# Patient Record
Sex: Male | Born: 1960 | Race: Black or African American | Hispanic: No | Marital: Single | Smoking: Never smoker
Health system: Southern US, Community
[De-identification: ages and names within clinical notes are randomized; demographics above are authoritative.]

## PROBLEM LIST (undated history)

## (undated) DIAGNOSIS — K219 Gastro-esophageal reflux disease without esophagitis: Secondary | ICD-10-CM

## (undated) DIAGNOSIS — I10 Essential (primary) hypertension: Secondary | ICD-10-CM

## (undated) DIAGNOSIS — N529 Male erectile dysfunction, unspecified: Secondary | ICD-10-CM

## (undated) DIAGNOSIS — Z8619 Personal history of other infectious and parasitic diseases: Secondary | ICD-10-CM

## (undated) HISTORY — DX: Male erectile dysfunction, unspecified: N52.9

## (undated) HISTORY — DX: Personal history of other infectious and parasitic diseases: Z86.19

## (undated) HISTORY — PX: BACK SURGERY: SHX140

## (undated) HISTORY — PX: SHOULDER SURGERY: SHX246

## (undated) HISTORY — DX: Gastro-esophageal reflux disease without esophagitis: K21.9

---

## 1995-12-15 HISTORY — PX: INGUINAL HERNIA REPAIR: SUR1180

## 2005-02-11 DIAGNOSIS — Z8619 Personal history of other infectious and parasitic diseases: Secondary | ICD-10-CM

## 2005-02-11 HISTORY — DX: Personal history of other infectious and parasitic diseases: Z86.19

## 2007-12-12 ENCOUNTER — Encounter: Admission: RE | Admit: 2007-12-12 | Discharge: 2007-12-12 | Payer: Self-pay | Admitting: Interventional Cardiology

## 2007-12-14 ENCOUNTER — Ambulatory Visit (HOSPITAL_COMMUNITY): Admission: RE | Admit: 2007-12-14 | Discharge: 2007-12-14 | Payer: Self-pay | Admitting: Interventional Cardiology

## 2008-07-12 ENCOUNTER — Encounter: Admission: RE | Admit: 2008-07-12 | Discharge: 2008-07-12 | Payer: Self-pay | Admitting: Family Medicine

## 2011-01-05 ENCOUNTER — Encounter: Payer: Self-pay | Admitting: Family Medicine

## 2011-04-28 NOTE — Cardiovascular Report (Signed)
Andrew Blevins, Andrew Blevins                ACCOUNT NO.:  [id number]   MEDICAL RECORD NO.:  [id number]          PATIENT TYPE:  OIB   LOCATION:  2852                         FACILITY:  [facility]   PHYSICIAN:  Lyn Records, M.D.   DATE OF BIRTH:  1961/05/19   DATE OF PROCEDURE:  DATE OF DISCHARGE:                            CARDIAC CATHETERIZATION   INDICATIONS:  The patient has had atypical chest pain and two abnormal  stress tests.  The first test was done in November by Dr. Donette Larry.  It  was abnormal because of exercise-induced ventricular ectopy.  A  Cardiolite 2-day myocardial perfusion study done in December  demonstrated what appeared to be anteroapical ischemia and possibly  diaphragm attenuation with inferior wall decreased uptake.  After  speaking with Mr. Buchberger we decided to proceed with coronary angiography  to document anatomy given the patient's family history of coronary  artery disease (brother with bypass surgery at age 52) and  hyperlipidemia.   PROCEDURE PERFORMED:  1. Left heart cath.  2. Selective coronary angiography.  3. Left ventriculography.  4. Angio-Seal arteriotomy closure.   DESCRIPTION:  After informed consent a 6-French sheath was placed in the  right femoral artery using modified Seldinger technique.  A 6-French A2  multipurpose catheter was used for hemodynamic recordings, left  ventriculography by hand injection, and selective left and right  coronary angiography.  The patient tolerated the procedure without  complications.   RESULTS:  1. Hemodynamic data:      a.     Aortic pressure 105/5 mm mercury.      b.     Aortic pressure 104/64 mmHg.  2. Left ventriculography:  The left ventricle is normal in size and      demonstrates normal contractility.  The ejection fraction is 50%.  3. Coronary angiography.      a.     Left main coronary:  Normal and widely patent.      b.     Left anterior descending coronary:  The left anterior       descending coronary  artery is widely patent.  It gives origin to       two large diagonal branches.  The LAD wraps around the left       ventricular apex.  No significant obstructive lesions are noted.       The LAD and branches are entirely smooth.      c.     Circumflex artery:  The circumflex coronary artery is a       large vessel that gives origin to four obtuse marginal branches.       The first two branches are small.  The third and fourth branches       are large.  The circumflex system is large and widely patent.      d.     Right coronary:  The right coronary artery is the dominant       vessel.  It gives origin to the posterior descending and two small       left ventricular branches, evident tortuosity.  There  is no       abnormality in the right coronary.   CONCLUSION:  1. Normal coronary arteries.  2. Low normal LV function with EF of 50%.   PLAN:  No further cardiac evaluation.  Consider GI sources of pain.  Exercise-induced ventricular ectopy of uncertain significance.  Low  normal LV function needs to be watched over time.  The patient's blood  pressure needs to be managed aggressively to prevent left ventricular  dysfunction.      Lyn Records, M.D.  Electronically Signed     HWS/MEDQ  D:  12/14/2007  T:  12/15/2007  Job:  [id number]   cc:   Georgann Housekeeper, MD

## 2012-01-02 ENCOUNTER — Emergency Department (INDEPENDENT_AMBULATORY_CARE_PROVIDER_SITE_OTHER): Payer: 59

## 2012-01-02 ENCOUNTER — Other Ambulatory Visit: Payer: Self-pay

## 2012-01-02 ENCOUNTER — Encounter (HOSPITAL_BASED_OUTPATIENT_CLINIC_OR_DEPARTMENT_OTHER): Payer: Self-pay | Admitting: *Deleted

## 2012-01-02 ENCOUNTER — Emergency Department (HOSPITAL_BASED_OUTPATIENT_CLINIC_OR_DEPARTMENT_OTHER)
Admission: EM | Admit: 2012-01-02 | Discharge: 2012-01-02 | Disposition: A | Discharge status: Home / Self Care | Payer: 59 | Attending: Emergency Medicine | Admitting: Emergency Medicine

## 2012-01-02 DIAGNOSIS — R079 Chest pain, unspecified: Secondary | ICD-10-CM

## 2012-01-02 DIAGNOSIS — R071 Chest pain on breathing: Secondary | ICD-10-CM | POA: Insufficient documentation

## 2012-01-02 DIAGNOSIS — R0789 Other chest pain: Secondary | ICD-10-CM

## 2012-01-02 DIAGNOSIS — I10 Essential (primary) hypertension: Secondary | ICD-10-CM | POA: Insufficient documentation

## 2012-01-02 HISTORY — DX: Essential (primary) hypertension: I10

## 2012-01-02 NOTE — ED Notes (Signed)
Pt reports chest pain started on Thursday [facility] [employer] [facility]- states pain increased with breathing- reports slight SOB with movement

## 2012-01-02 NOTE — ED Provider Notes (Signed)
History    Scribed for Hurman Horn, MD, the patient was seen in room MH08/MH08. This chart was scribed by Katha Cabal.   CSN: [id number]  Arrival date & time 01/02/12  1255   First MD Initiated Contact with Patient 01/02/12 1509      Chief Complaint  Patient presents with  . Chest Pain    (Consider location/radiation/quality/duration/timing/severity/associated sxs/prior treatment) HPI  Andrew Blevins is a 51 y.o. male who presents to the Emergency Department complaining of intermittent chest pain that began several days ago.  Symptoms are associated with postitional torso discomfort.  Symptoms are not associated with exertion, fever, weakness, cough, syncope or shortness of breath.  Patient was working out with his son a couple days ago and has experienced intermittent chest pain since onset..  Patient just started working out again.  Pain in keft lateral chest wall that increased certain position changes.  Patient states that he feels like he strained a muscle.  Nonradiating localized tenderness constant for days without treatment PTA.       Past Medical History  Diagnosis Date  . Hypertension     Past Surgical History  Procedure Date  . Hernia repair   . Shoulder surgery     No family history on file.  History  Substance Use Topics  . Smoking status: Never Smoker   . Smokeless tobacco: Never Used  . Alcohol Use: Yes     occasional      Review of Systems  Constitutional:       10 Systems reviewed and are negative for acute change except as noted in the HPI.  HENT: Negative for congestion.   Eyes: Negative for discharge and redness.  Cardiovascular: Positive for chest pain.  Musculoskeletal: Negative for back pain.  Skin: Negative for rash.  Psychiatric/Behavioral:       No behavior change.    Allergies  Review of patient's allergies indicates no known allergies.  Home Medications   Current Outpatient Rx  Name Route Sig Dispense Refill  .  OLMESARTAN MEDOXOMIL 20 MG PO TABS Oral Take 20 mg by mouth daily.    Marland Kitchen RABEPRAZOLE SODIUM 20 MG PO TBEC Oral Take 20 mg by mouth daily.      BP 110/65  Pulse 87  Temp(Src) 98.9 F (37.2 C) (Oral)  Resp 20  SpO2 96%  Physical Exam  Nursing note and vitals reviewed. Constitutional:       Awake, alert, nontoxic appearance.  HENT:  Head: Atraumatic.  Eyes: EOM are normal. Right eye exhibits no discharge. Left eye exhibits no discharge.  Neck: Neck supple.  Cardiovascular: Normal rate, regular rhythm and normal heart sounds.   No murmur heard. Pulmonary/Chest: Effort normal. He exhibits tenderness.       Reproducible Left upper, left lateral chest wall tenderness  Abdominal: Soft. There is no tenderness. There is no rebound.  Musculoskeletal: He exhibits no tenderness.       Baseline ROM, no obvious new focal weakness.  Lymphadenopathy:    He has no cervical adenopathy.  Neurological:       Mental status and motor strength appears baseline for patient and situation.  Skin: No rash noted.  Psychiatric: He has a normal mood and affect.    ED Course  Procedures (including critical care time) ECG: Normal sinus rhythm, ventricular rate 74, normal axis, normal intervals, no acute ischemic changes noted, no comparison available impression normal ECG  DIAGNOSTIC STUDIES: Oxygen Saturation is 96% on room air, normal  by my interpretation.     COORDINATION OF CARE: 3:14 PM  Physical exam complete.   Will order EKG and CXR.  4:25 PM  Chest xray negative.   4:27 PM  Discussed radiological findings with patient.  Plan to discharge patient home.  Patient agrees with plan.       LABS / RADIOLOGY:   Labs Reviewed - No data to display Dg Chest 2 View  01/02/2012  *RADIOLOGY REPORT*  Clinical Data: Chest pain  CHEST - 2 VIEW  Comparison:  None.  Findings:  The heart size and mediastinal contours are within normal limits.  Both lungs are clear.  The visualized skeletal structures are  unremarkable.  IMPRESSION: No active cardiopulmonary disease.  Original Report Authenticated By: Harrel Lemon, M.D.     1. Chest wall pain       MDM  I doubt any other [facility] precluding discharge at this time including, but not necessarily limited to the following:ACS.   I personally performed the services described in this documentation, which was scribed in my presence. The recorded information has been reviewed and considered.  Hurman Horn, MD 01/03/12 1146

## 2012-01-13 ENCOUNTER — Emergency Department (HOSPITAL_BASED_OUTPATIENT_CLINIC_OR_DEPARTMENT_OTHER)
Admission: EM | Admit: 2012-01-13 | Discharge: 2012-01-13 | Disposition: A | Discharge status: Home / Self Care | Payer: 59 | Attending: Emergency Medicine | Admitting: Emergency Medicine

## 2012-01-13 ENCOUNTER — Encounter (HOSPITAL_BASED_OUTPATIENT_CLINIC_OR_DEPARTMENT_OTHER): Payer: Self-pay | Admitting: *Deleted

## 2012-01-13 DIAGNOSIS — B9789 Other viral agents as the cause of diseases classified elsewhere: Secondary | ICD-10-CM

## 2012-01-13 DIAGNOSIS — R059 Cough, unspecified: Secondary | ICD-10-CM | POA: Insufficient documentation

## 2012-01-13 DIAGNOSIS — R05 Cough: Secondary | ICD-10-CM | POA: Insufficient documentation

## 2012-01-13 DIAGNOSIS — I10 Essential (primary) hypertension: Secondary | ICD-10-CM | POA: Insufficient documentation

## 2012-01-13 DIAGNOSIS — IMO0001 Reserved for inherently not codable concepts without codable children: Secondary | ICD-10-CM | POA: Insufficient documentation

## 2012-01-13 MED ORDER — KETOROLAC TROMETHAMINE 60 MG/2ML IM SOLN
60.0000 mg | Freq: Once | INTRAMUSCULAR | Status: AC
  Administered 2012-01-13: 60 mg via INTRAMUSCULAR
  Filled 2012-01-13: qty 2

## 2012-01-13 NOTE — ED Provider Notes (Signed)
History     CSN: [id number]  Arrival date & time 01/13/12  1313   First MD Initiated Contact with Patient 01/13/12 1328      Chief Complaint  Patient presents with  . Fever  . Cough    (Consider location/radiation/quality/duration/timing/severity/associated sxs/prior treatment) Patient is a 51 y.o. male presenting with fever and cough. The history is provided by the patient. No language interpreter was used.  Fever Primary symptoms of the febrile illness include fever, cough and myalgias. The current episode started 2 days ago. This is a new problem. The problem has not changed since onset. The fever began 2 days ago. The fever has been unchanged since its onset. The maximum temperature recorded prior to his arrival was 100 to 100.9 F.  The cough began 2 days ago. The cough is productive. The sputum is green.  Cough Associated symptoms include myalgias.  No known exposure to sick contacts.  Past Medical History  Diagnosis Date  . Hypertension     Past Surgical History  Procedure Date  . Hernia repair   . Shoulder surgery     History reviewed. No pertinent family history.  History  Substance Use Topics  . Smoking status: Never Smoker   . Smokeless tobacco: Never Used  . Alcohol Use: Yes     occasional      Review of Systems  Constitutional: Positive for fever.  Respiratory: Positive for cough.   Musculoskeletal: Positive for myalgias.  All other systems reviewed and are negative.    Allergies  Review of patient's allergies indicates no known allergies.  Home Medications   Current Outpatient Rx  Name Route Sig Dispense Refill  . OLMESARTAN MEDOXOMIL 20 MG PO TABS Oral Take 20 mg by mouth daily.    Marland Kitchen RABEPRAZOLE SODIUM 20 MG PO TBEC Oral Take 20 mg by mouth daily.      BP 157/83  Pulse 107  Temp(Src) 100.4 F (38 C) (Oral)  Resp 16  Ht 6' (1.829 m)  Wt 260 lb (117.935 kg)  BMI 35.26 kg/m2  SpO2 100%  Physical Exam  Constitutional: He is  oriented to person, place, and time. He appears well-developed and well-nourished.  HENT:  Head: Normocephalic.  Mouth/Throat: Oropharynx is clear and moist.  Eyes: Pupils are equal, round, and reactive to light.  Neck: Normal range of motion. Neck supple.  Cardiovascular: Normal rate, regular rhythm, normal heart sounds and intact distal pulses.   Pulmonary/Chest: Effort normal and breath sounds normal. He has no wheezes.  Abdominal: Soft. Bowel sounds are normal.  Musculoskeletal: Normal range of motion.  Neurological: He is alert and oriented to person, place, and time.  Skin: Skin is warm and dry.  Psychiatric: He has a normal mood and affect.    ED Course  Procedures (including critical care time)  Labs Reviewed - No data to display No results found.   No diagnosis found.   Viral URI MDM          Jimmye Norman, NP 01/13/12 1356

## 2012-01-13 NOTE — ED Provider Notes (Signed)
Medical screening examination/treatment/procedure(s) were performed by non-physician practitioner and as supervising physician I was immediately available for consultation/collaboration.   Janelis Stelzer, MD 01/13/12 1435 

## 2012-01-13 NOTE — ED Notes (Signed)
Pt c/o body aches , cough and fever x 2 days

## 2012-06-13 ENCOUNTER — Ambulatory Visit (INDEPENDENT_AMBULATORY_CARE_PROVIDER_SITE_OTHER): Payer: 59 | Admitting: Family Medicine

## 2012-06-13 VITALS — BP 120/68 | HR 88 | Temp 98.9°F | Resp 18 | Ht 71.75 in | Wt 275.0 lb

## 2012-06-13 DIAGNOSIS — M722 Plantar fascial fibromatosis: Secondary | ICD-10-CM

## 2012-06-13 DIAGNOSIS — T7840XA Allergy, unspecified, initial encounter: Secondary | ICD-10-CM

## 2012-06-13 MED ORDER — PREDNISONE 20 MG PO TABS: ORAL_TABLET | ORAL | Status: DC

## 2012-06-13 NOTE — Progress Notes (Signed)
This 51 year old man works for the [employer] and was laid off recently. 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 He has a history of plantar fasciitis and comes in with recurrence of the right heel pain.  He also has a problem with periorbital swelling which came up over the last 24 hours. His niece has a similar problem and was hospitalized for this. It is questionable whether some mold in the house. Is no change his residence lip lately though.  Objective: No acute distress Eyes: Mild periorbital edema with normal fundi Oropharynx: Clear Chest: Normal respirations clear to auscultation Extremities: Normal appearance of right foot with tenderness in the medial proximal heel consistent with plantar fasciitis  Assessment: Right plantar fasciitis, recurrent and acute allergic reaction related to some environmental stimulus  Plan: 1. Allergic reaction  predniSONE (DELTASONE) 20 MG tablet  2. Plantar fasciitis of right foot  predniSONE (DELTASONE) 20 MG tablet

## 2012-08-15 ENCOUNTER — Ambulatory Visit (INDEPENDENT_AMBULATORY_CARE_PROVIDER_SITE_OTHER): Payer: 59 | Admitting: Family Medicine

## 2012-08-15 VITALS — BP 114/74 | HR 94 | Temp 98.1°F | Resp 16 | Ht 71.0 in | Wt 288.4 lb

## 2012-08-15 DIAGNOSIS — M25511 Pain in right shoulder: Secondary | ICD-10-CM

## 2012-08-15 DIAGNOSIS — M25519 Pain in unspecified shoulder: Secondary | ICD-10-CM

## 2012-08-15 MED ORDER — METHYLPREDNISOLONE ACETATE 80 MG/ML IJ SUSP
80.0000 mg | Freq: Once | INTRAMUSCULAR | Status: AC
  Administered 2012-08-15: 80 mg via INTRA_ARTICULAR

## 2012-08-15 NOTE — Progress Notes (Signed)
51 yo retired [employer] ge[employer] who has right shoulder soreness after doing some bench presses one week ago.  Pain is worse with abduction beyond 90 degrees.  Cannot sleep on right side.  Objective:  NAD Right shoulder:  Tender anterior joint line AROM:  Pain with biceps flexion PROM:  Lacks 30 degrees abduction Able to fully internally rotate the right shoulder.  Injected right anterior shoulder joint line with Marcaine 0.5% and DepoMedrol 80 mg using No. 25 gauge 1 and 1/2 needle after informed consent.  No complications  Assessment:  Right biceps tendonitis  Plan:  Should see improvement over 24 hours and resolution 2-3 days.

## 2012-12-28 ENCOUNTER — Ambulatory Visit: Payer: 59

## 2012-12-28 ENCOUNTER — Ambulatory Visit (INDEPENDENT_AMBULATORY_CARE_PROVIDER_SITE_OTHER): Payer: 59 | Admitting: Family Medicine

## 2012-12-28 VITALS — BP 118/77 | HR 95 | Temp 100.0°F | Resp 20 | Ht 71.0 in | Wt 279.6 lb

## 2012-12-28 DIAGNOSIS — R05 Cough: Secondary | ICD-10-CM

## 2012-12-28 DIAGNOSIS — R509 Fever, unspecified: Secondary | ICD-10-CM

## 2012-12-28 DIAGNOSIS — J40 Bronchitis, not specified as acute or chronic: Secondary | ICD-10-CM

## 2012-12-28 DIAGNOSIS — R9389 Abnormal findings on diagnostic imaging of other specified body structures: Secondary | ICD-10-CM

## 2012-12-28 DIAGNOSIS — R059 Cough, unspecified: Secondary | ICD-10-CM

## 2012-12-28 LAB — POCT CBC
Granulocyte percent: 46.2 %G (ref 37–80)
HCT, POC: 46 % (ref 43.5–53.7)
Hemoglobin: 14.3 g/dL (ref 14.1–18.1)
Lymph, poc: 2.8 (ref 0.6–3.4)
MCH, POC: 28.9 pg (ref 27–31.2)
MCHC: 31.1 g/dL — AB (ref 31.8–35.4)
MCV: 93.1 fL (ref 80–97)
MID (cbc): 0.6 (ref 0–0.9)
MPV: 9.1 fL (ref 0–99.8)
POC Granulocyte: 2.9 (ref 2–6.9)
POC LYMPH PERCENT: 44.1 %L (ref 10–50)
POC MID %: 9.7 %M (ref 0–12)
Platelet Count, POC: 215 10*3/uL (ref 142–424)
RBC: 4.94 M/uL (ref 4.69–6.13)
RDW, POC: 13.2 %
WBC: 6.3 10*3/uL (ref 4.6–10.2)

## 2012-12-28 MED ORDER — HYDROCODONE-HOMATROPINE 5-1.5 MG/5ML PO SYRP: 5.0000 mL | ORAL_SOLUTION | Freq: Three times a day (TID) | ORAL | Status: DC | PRN

## 2012-12-28 MED ORDER — AZITHROMYCIN 250 MG PO TABS: ORAL_TABLET | ORAL | Status: DC

## 2012-12-28 NOTE — Progress Notes (Signed)
  Subjective:    Patient ID: Andrew Blevins, male    DOB: [birth year], 52 y.o.   MRN: [id number]  HPI 52 year old male presents with 5 day history of fever, chills, body aches, dry cough, nasal congestion, and postnasal drainage.  States symptoms started suddenly and have persisted through the week.  States he has had a burning pain in his chest with his cough.  Denies SOB, wheezing, dizziness, headache, sore throat, otalgia, nausea or vomiting. Did have 1 episode of loose stools at the onset of this illness.  He has been taking Nyquil which has helped somewhat with his symptoms.  He does have a history of HTN that is well contrlled on Benicar.  He is otherwise healthy without any other concerns today.     Review of Systems  Constitutional: Positive for fever and chills.  HENT: Positive for congestion, rhinorrhea and postnasal drip. Negative for ear pain, sore throat, neck pain and neck stiffness.   Respiratory: Positive for cough. Negative for chest tightness, shortness of breath and wheezing.   Cardiovascular: Negative for chest pain.  Gastrointestinal: Negative for nausea, vomiting and abdominal pain.  Skin: Negative for rash.  Neurological: Negative for dizziness and headaches.       Objective:   Physical Exam  Constitutional: He is oriented to person, place, and time. He appears well-developed and well-nourished.  HENT:  Head: Normocephalic and atraumatic.  Right Ear: Hearing, tympanic membrane, external ear and ear canal normal.  Left Ear: Hearing, tympanic membrane, external ear and ear canal normal.  Mouth/Throat: Uvula is midline, oropharynx is clear and moist and mucous membranes are normal. No oropharyngeal exudate.  Eyes: Conjunctivae normal are normal.  Neck: Normal range of motion. Neck supple.  Cardiovascular: Normal rate, regular rhythm and normal heart sounds.   Pulmonary/Chest: Effort normal and breath sounds normal.  Lymphadenopathy:    He has no cervical adenopathy.    Neurological: He is alert and oriented to person, place, and time.  Psychiatric: He has a normal mood and affect. His behavior is normal. Judgment and thought content normal.     [facility] reading (PRIMARY) by  Dr. Neva Seat as increase markings in left and right lower lobes. No infiltrate or consolidation noted.        Assessment & Plan:   1. Bronchitis  azithromycin (ZITHROMAX) 250 MG tablet  2. Fever  POCT CBC, DG Chest 2 View  3. Cough  POCT CBC, DG Chest 2 View, HYDROcodone-homatropine (HYCODAN) 5-1.5 MG/5ML syrup  Likely illness due to influenza - at 5 days out from onset of symptoms, unable to treat with Tamiflu Will go ahead and cover with Zpack Hycodan qhs prn cough Increase fluids and rest Ibuprofen and tylenol q3-4hours as needed for fever Recommend he stay out of work until 12/31/12.   Follow up if symptoms worsen or fail to improve.

## 2012-12-29 NOTE — Progress Notes (Signed)
Xray read and patient discussed with Ms. Marte. Agree with assessment and plan of care per her note. CXR report noted:  IMPRESSION:  Mild asymmetric prominence of the right hilum. Comparison with prior examinations would be helpful. In the absence of prior examinations, CT chest with contrast would be informative, as  clinically indicated.  See notes by Ms Renato Gails - discussed options of recheck CXR in few weeks post infection vs. CT - proceeding with CT.

## 2012-12-30 ENCOUNTER — Telehealth: Payer: Self-pay | Admitting: Radiology

## 2012-12-30 NOTE — Telephone Encounter (Signed)
Authorization - exp 02/13/2013 - [id number]

## 2012-12-30 NOTE — Telephone Encounter (Signed)
CT chest with Contrast needs peer to peer case # [id number] phone # [phone] option #3

## 2013-01-02 ENCOUNTER — Telehealth: Payer: Self-pay

## 2013-01-02 NOTE — Telephone Encounter (Signed)
PATIENT CALLED WAITING ON RESULTS FROM LAST WEEK AND WAS TOLD TO CALL BACK BY HEATHER MARTE. PLEASE CALL BACK WHENEVER RESULTS ARE READY. THANK YOU!

## 2013-01-02 NOTE — Telephone Encounter (Signed)
Herbert Seta, are you waiting for any other results for pt? It looks like you already talked w/ pt about xray results on 12/28/12, and the CT has not been performed yet. I don't see any results in your inbasket for pt or in the already reviewed inbasket for results.

## 2013-01-02 NOTE — Telephone Encounter (Signed)
Left message to return call 

## 2013-01-02 NOTE — Telephone Encounter (Signed)
There are no pending labs, we will need to wait for results of CT. How is he feeling?

## 2013-01-03 NOTE — Telephone Encounter (Signed)
Called patient, he was letting us know that he had not heard back from CT scheduling yet, but he did get a call today.

## 2013-01-03 NOTE — Telephone Encounter (Signed)
His CT is scheduled for tomorrow. He has been advised of the results of chest xray already: Mild asymmetric prominence of the right hilum. Comparison with prior examinations would be helpful. In the absence of prior  examinations, CT chest with contrast would be informative, as clinically indicated. These results will be called to the ordering clinician or representative by the Radiologist Assistant, and communication documented in the PACS Dashboard.

## 2013-01-04 ENCOUNTER — Ambulatory Visit
Admission: RE | Admit: 2013-01-04 | Discharge: 2013-01-04 | Disposition: A | Discharge status: Home / Self Care | Payer: 59 | Source: Ambulatory Visit | Attending: Physician Assistant | Admitting: Physician Assistant

## 2013-01-04 DIAGNOSIS — R05 Cough: Secondary | ICD-10-CM

## 2013-01-04 DIAGNOSIS — R059 Cough, unspecified: Secondary | ICD-10-CM

## 2013-01-04 DIAGNOSIS — R9389 Abnormal findings on diagnostic imaging of other specified body structures: Secondary | ICD-10-CM

## 2013-01-04 MED ORDER — IOHEXOL 300 MG/ML  SOLN
75.0000 mL | Freq: Once | INTRAMUSCULAR | Status: AC | PRN
  Administered 2013-01-04: 75 mL via INTRAVENOUS

## 2013-01-06 ENCOUNTER — Telehealth: Payer: Self-pay

## 2013-01-06 DIAGNOSIS — R9389 Abnormal findings on diagnostic imaging of other specified body structures: Secondary | ICD-10-CM

## 2013-01-06 NOTE — Telephone Encounter (Signed)
Called him after I put in cardiology referral. Lungs look good, but concerned about possible pulmonary arterial hypertension, he has been referred to cardiology to have this checked (cardiology should order the echo per [facility]). I have advised patient, he states he did see cardiology for this in the past, but does not recall who this was. He is agreeable to appt. He states his cough is better now and he will let us know if anything else is needed. Thanks. Letricia Krinsky

## 2013-01-06 NOTE — Telephone Encounter (Signed)
I called Radiology spoke to Erskine Squibb regarding incidental finding of 4mm lesion in liver, to see if further imaging is done of this area, what would the radiologist recommend? Spoke to Dr Eppie Gibson, he does not recommend anything with this area because it is so small.

## 2013-01-06 NOTE — Telephone Encounter (Signed)
Patient wants CT report. Please advise what I should tell him. Amy  1. Scattered small areas of airspace consolidation in the  peripheral aspect of the right middle lobe and left upper lobe,  favoring residual/resolving bronchopneumonia.  2. Pulmonary arterial hypertension.    Please see body of report, there are incidental findings noted there also, Thanks.

## 2013-01-06 NOTE — Telephone Encounter (Signed)
PT IS WONDERING IF Andrew Blevins HAS GOTTEN THE RESULTS FROM HIS CHEST X-RAY   CALL [phone] OR [phone]

## 2013-01-06 NOTE — Telephone Encounter (Signed)
The CT showed evidence of pulmonary arterial hypertension so we need to order an echo to look at his heart.  There was an incidental finding that showed a small possible nodule on his liver but the radiologist does not recommend any additional studies of this because the area is so small.  We will order the echo for the upcoming week. Also, how is his cough?

## 2013-01-18 ENCOUNTER — Other Ambulatory Visit (HOSPITAL_COMMUNITY): Payer: Self-pay | Admitting: Cardiology

## 2013-01-18 DIAGNOSIS — I272 Pulmonary hypertension, unspecified: Secondary | ICD-10-CM

## 2013-01-23 ENCOUNTER — Ambulatory Visit (HOSPITAL_COMMUNITY)
Admission: RE | Admit: 2013-01-23 | Discharge: 2013-01-23 | Disposition: A | Discharge status: Home / Self Care | Payer: 59 | Source: Ambulatory Visit | Attending: Cardiology | Admitting: Cardiology

## 2013-01-23 DIAGNOSIS — I10 Essential (primary) hypertension: Secondary | ICD-10-CM | POA: Insufficient documentation

## 2013-01-23 DIAGNOSIS — I272 Pulmonary hypertension, unspecified: Secondary | ICD-10-CM

## 2013-01-23 DIAGNOSIS — I270 Primary pulmonary hypertension: Secondary | ICD-10-CM | POA: Insufficient documentation

## 2013-01-23 NOTE — Progress Notes (Signed)
2D Echo Performed 01/23/2013    Annistyn Depass, RCS  

## 2015-07-05 ENCOUNTER — Encounter: Payer: Self-pay | Admitting: *Deleted

## 2015-08-06 ENCOUNTER — Encounter: Payer: Self-pay | Admitting: Cardiology

## 2016-04-07 ENCOUNTER — Other Ambulatory Visit: Payer: Self-pay | Admitting: Internal Medicine

## 2016-04-07 DIAGNOSIS — N62 Hypertrophy of breast: Secondary | ICD-10-CM

## 2016-04-09 ENCOUNTER — Ambulatory Visit
Admission: RE | Admit: 2016-04-09 | Discharge: 2016-04-09 | Disposition: A | Discharge status: Home / Self Care | Payer: 59 | Source: Ambulatory Visit | Attending: Internal Medicine | Admitting: Internal Medicine

## 2016-04-09 DIAGNOSIS — N62 Hypertrophy of breast: Secondary | ICD-10-CM

## 2017-02-10 DIAGNOSIS — E782 Mixed hyperlipidemia: Secondary | ICD-10-CM | POA: Diagnosis not present

## 2017-02-10 DIAGNOSIS — J309 Allergic rhinitis, unspecified: Secondary | ICD-10-CM | POA: Diagnosis not present

## 2017-02-10 DIAGNOSIS — I10 Essential (primary) hypertension: Secondary | ICD-10-CM | POA: Diagnosis not present

## 2017-02-18 DIAGNOSIS — G4733 Obstructive sleep apnea (adult) (pediatric): Secondary | ICD-10-CM | POA: Diagnosis not present

## 2017-03-18 DIAGNOSIS — H40013 Open angle with borderline findings, low risk, bilateral: Secondary | ICD-10-CM | POA: Diagnosis not present

## 2017-03-18 DIAGNOSIS — H25013 Cortical age-related cataract, bilateral: Secondary | ICD-10-CM | POA: Diagnosis not present

## 2017-03-18 DIAGNOSIS — H2513 Age-related nuclear cataract, bilateral: Secondary | ICD-10-CM | POA: Diagnosis not present

## 2017-03-25 DIAGNOSIS — M9903 Segmental and somatic dysfunction of lumbar region: Secondary | ICD-10-CM | POA: Diagnosis not present

## 2017-03-25 DIAGNOSIS — M9904 Segmental and somatic dysfunction of sacral region: Secondary | ICD-10-CM | POA: Diagnosis not present

## 2017-03-25 DIAGNOSIS — M5430 Sciatica, unspecified side: Secondary | ICD-10-CM | POA: Diagnosis not present

## 2017-03-26 DIAGNOSIS — M5430 Sciatica, unspecified side: Secondary | ICD-10-CM | POA: Diagnosis not present

## 2017-03-26 DIAGNOSIS — M9904 Segmental and somatic dysfunction of sacral region: Secondary | ICD-10-CM | POA: Diagnosis not present

## 2017-03-26 DIAGNOSIS — M9903 Segmental and somatic dysfunction of lumbar region: Secondary | ICD-10-CM | POA: Diagnosis not present

## 2017-03-30 DIAGNOSIS — M9904 Segmental and somatic dysfunction of sacral region: Secondary | ICD-10-CM | POA: Diagnosis not present

## 2017-03-30 DIAGNOSIS — M5430 Sciatica, unspecified side: Secondary | ICD-10-CM | POA: Diagnosis not present

## 2017-03-30 DIAGNOSIS — M9903 Segmental and somatic dysfunction of lumbar region: Secondary | ICD-10-CM | POA: Diagnosis not present

## 2017-04-01 DIAGNOSIS — M9904 Segmental and somatic dysfunction of sacral region: Secondary | ICD-10-CM | POA: Diagnosis not present

## 2017-04-01 DIAGNOSIS — M5430 Sciatica, unspecified side: Secondary | ICD-10-CM | POA: Diagnosis not present

## 2017-04-01 DIAGNOSIS — M9903 Segmental and somatic dysfunction of lumbar region: Secondary | ICD-10-CM | POA: Diagnosis not present

## 2017-04-02 DIAGNOSIS — M5430 Sciatica, unspecified side: Secondary | ICD-10-CM | POA: Diagnosis not present

## 2017-04-02 DIAGNOSIS — M9904 Segmental and somatic dysfunction of sacral region: Secondary | ICD-10-CM | POA: Diagnosis not present

## 2017-04-02 DIAGNOSIS — M9903 Segmental and somatic dysfunction of lumbar region: Secondary | ICD-10-CM | POA: Diagnosis not present

## 2017-04-06 DIAGNOSIS — M5430 Sciatica, unspecified side: Secondary | ICD-10-CM | POA: Diagnosis not present

## 2017-04-06 DIAGNOSIS — M9903 Segmental and somatic dysfunction of lumbar region: Secondary | ICD-10-CM | POA: Diagnosis not present

## 2017-04-06 DIAGNOSIS — M9904 Segmental and somatic dysfunction of sacral region: Secondary | ICD-10-CM | POA: Diagnosis not present

## 2017-04-08 DIAGNOSIS — M5430 Sciatica, unspecified side: Secondary | ICD-10-CM | POA: Diagnosis not present

## 2017-04-08 DIAGNOSIS — M9904 Segmental and somatic dysfunction of sacral region: Secondary | ICD-10-CM | POA: Diagnosis not present

## 2017-04-08 DIAGNOSIS — M9903 Segmental and somatic dysfunction of lumbar region: Secondary | ICD-10-CM | POA: Diagnosis not present

## 2017-04-13 DIAGNOSIS — M9903 Segmental and somatic dysfunction of lumbar region: Secondary | ICD-10-CM | POA: Diagnosis not present

## 2017-04-13 DIAGNOSIS — M9904 Segmental and somatic dysfunction of sacral region: Secondary | ICD-10-CM | POA: Diagnosis not present

## 2017-04-13 DIAGNOSIS — M5430 Sciatica, unspecified side: Secondary | ICD-10-CM | POA: Diagnosis not present

## 2017-04-15 DIAGNOSIS — M9903 Segmental and somatic dysfunction of lumbar region: Secondary | ICD-10-CM | POA: Diagnosis not present

## 2017-04-15 DIAGNOSIS — M5430 Sciatica, unspecified side: Secondary | ICD-10-CM | POA: Diagnosis not present

## 2017-04-15 DIAGNOSIS — M9904 Segmental and somatic dysfunction of sacral region: Secondary | ICD-10-CM | POA: Diagnosis not present

## 2017-04-20 DIAGNOSIS — M9904 Segmental and somatic dysfunction of sacral region: Secondary | ICD-10-CM | POA: Diagnosis not present

## 2017-04-20 DIAGNOSIS — M9903 Segmental and somatic dysfunction of lumbar region: Secondary | ICD-10-CM | POA: Diagnosis not present

## 2017-04-20 DIAGNOSIS — M5430 Sciatica, unspecified side: Secondary | ICD-10-CM | POA: Diagnosis not present

## 2017-04-22 DIAGNOSIS — M9903 Segmental and somatic dysfunction of lumbar region: Secondary | ICD-10-CM | POA: Diagnosis not present

## 2017-04-22 DIAGNOSIS — M9904 Segmental and somatic dysfunction of sacral region: Secondary | ICD-10-CM | POA: Diagnosis not present

## 2017-04-22 DIAGNOSIS — M5430 Sciatica, unspecified side: Secondary | ICD-10-CM | POA: Diagnosis not present

## 2017-04-27 DIAGNOSIS — M9904 Segmental and somatic dysfunction of sacral region: Secondary | ICD-10-CM | POA: Diagnosis not present

## 2017-04-27 DIAGNOSIS — M9903 Segmental and somatic dysfunction of lumbar region: Secondary | ICD-10-CM | POA: Diagnosis not present

## 2017-04-27 DIAGNOSIS — M5430 Sciatica, unspecified side: Secondary | ICD-10-CM | POA: Diagnosis not present

## 2017-04-29 DIAGNOSIS — M5430 Sciatica, unspecified side: Secondary | ICD-10-CM | POA: Diagnosis not present

## 2017-04-29 DIAGNOSIS — M9903 Segmental and somatic dysfunction of lumbar region: Secondary | ICD-10-CM | POA: Diagnosis not present

## 2017-04-29 DIAGNOSIS — M9904 Segmental and somatic dysfunction of sacral region: Secondary | ICD-10-CM | POA: Diagnosis not present

## 2017-05-04 DIAGNOSIS — M9903 Segmental and somatic dysfunction of lumbar region: Secondary | ICD-10-CM | POA: Diagnosis not present

## 2017-05-04 DIAGNOSIS — M9904 Segmental and somatic dysfunction of sacral region: Secondary | ICD-10-CM | POA: Diagnosis not present

## 2017-05-04 DIAGNOSIS — M5430 Sciatica, unspecified side: Secondary | ICD-10-CM | POA: Diagnosis not present

## 2017-05-06 DIAGNOSIS — M9903 Segmental and somatic dysfunction of lumbar region: Secondary | ICD-10-CM | POA: Diagnosis not present

## 2017-05-06 DIAGNOSIS — M5430 Sciatica, unspecified side: Secondary | ICD-10-CM | POA: Diagnosis not present

## 2017-05-06 DIAGNOSIS — M9904 Segmental and somatic dysfunction of sacral region: Secondary | ICD-10-CM | POA: Diagnosis not present

## 2017-05-08 DIAGNOSIS — M5442 Lumbago with sciatica, left side: Secondary | ICD-10-CM | POA: Diagnosis not present

## 2017-05-21 DIAGNOSIS — M7062 Trochanteric bursitis, left hip: Secondary | ICD-10-CM | POA: Diagnosis not present

## 2017-05-21 DIAGNOSIS — M5136 Other intervertebral disc degeneration, lumbar region: Secondary | ICD-10-CM | POA: Diagnosis not present

## 2017-05-21 DIAGNOSIS — M5442 Lumbago with sciatica, left side: Secondary | ICD-10-CM | POA: Diagnosis not present

## 2017-06-09 DIAGNOSIS — M5442 Lumbago with sciatica, left side: Secondary | ICD-10-CM | POA: Diagnosis not present

## 2017-06-09 DIAGNOSIS — M533 Sacrococcygeal disorders, not elsewhere classified: Secondary | ICD-10-CM | POA: Diagnosis not present

## 2017-06-09 DIAGNOSIS — M5136 Other intervertebral disc degeneration, lumbar region: Secondary | ICD-10-CM | POA: Diagnosis not present

## 2017-06-23 DIAGNOSIS — M5442 Lumbago with sciatica, left side: Secondary | ICD-10-CM | POA: Diagnosis not present

## 2017-06-23 DIAGNOSIS — M5136 Other intervertebral disc degeneration, lumbar region: Secondary | ICD-10-CM | POA: Diagnosis not present

## 2017-06-23 DIAGNOSIS — M5416 Radiculopathy, lumbar region: Secondary | ICD-10-CM | POA: Diagnosis not present

## 2017-06-25 DIAGNOSIS — M48061 Spinal stenosis, lumbar region without neurogenic claudication: Secondary | ICD-10-CM | POA: Diagnosis not present

## 2017-06-25 DIAGNOSIS — M5127 Other intervertebral disc displacement, lumbosacral region: Secondary | ICD-10-CM | POA: Diagnosis not present

## 2017-07-01 DIAGNOSIS — M47816 Spondylosis without myelopathy or radiculopathy, lumbar region: Secondary | ICD-10-CM | POA: Diagnosis not present

## 2017-07-01 DIAGNOSIS — M5136 Other intervertebral disc degeneration, lumbar region: Secondary | ICD-10-CM | POA: Diagnosis not present

## 2017-07-01 DIAGNOSIS — M9983 Other biomechanical lesions of lumbar region: Secondary | ICD-10-CM | POA: Diagnosis not present

## 2017-07-13 DIAGNOSIS — M542 Cervicalgia: Secondary | ICD-10-CM | POA: Diagnosis not present

## 2017-07-13 DIAGNOSIS — M5030 Other cervical disc degeneration, unspecified cervical region: Secondary | ICD-10-CM | POA: Diagnosis not present

## 2017-07-15 DIAGNOSIS — M5442 Lumbago with sciatica, left side: Secondary | ICD-10-CM | POA: Diagnosis not present

## 2017-07-21 DIAGNOSIS — M5442 Lumbago with sciatica, left side: Secondary | ICD-10-CM | POA: Diagnosis not present

## 2017-07-23 DIAGNOSIS — M5442 Lumbago with sciatica, left side: Secondary | ICD-10-CM | POA: Diagnosis not present

## 2017-07-27 DIAGNOSIS — M5442 Lumbago with sciatica, left side: Secondary | ICD-10-CM | POA: Diagnosis not present

## 2017-07-29 DIAGNOSIS — M5136 Other intervertebral disc degeneration, lumbar region: Secondary | ICD-10-CM | POA: Diagnosis not present

## 2017-07-29 DIAGNOSIS — M47816 Spondylosis without myelopathy or radiculopathy, lumbar region: Secondary | ICD-10-CM | POA: Diagnosis not present

## 2017-07-29 DIAGNOSIS — M5416 Radiculopathy, lumbar region: Secondary | ICD-10-CM | POA: Diagnosis not present

## 2017-08-03 DIAGNOSIS — M5442 Lumbago with sciatica, left side: Secondary | ICD-10-CM | POA: Diagnosis not present

## 2017-08-05 DIAGNOSIS — M5416 Radiculopathy, lumbar region: Secondary | ICD-10-CM | POA: Diagnosis not present

## 2017-08-05 DIAGNOSIS — M5127 Other intervertebral disc displacement, lumbosacral region: Secondary | ICD-10-CM | POA: Diagnosis not present

## 2017-08-05 DIAGNOSIS — M4727 Other spondylosis with radiculopathy, lumbosacral region: Secondary | ICD-10-CM | POA: Diagnosis not present

## 2017-08-05 DIAGNOSIS — M549 Dorsalgia, unspecified: Secondary | ICD-10-CM | POA: Diagnosis not present

## 2017-08-10 DIAGNOSIS — M5116 Intervertebral disc disorders with radiculopathy, lumbar region: Secondary | ICD-10-CM | POA: Diagnosis not present

## 2017-08-10 DIAGNOSIS — M5126 Other intervertebral disc displacement, lumbar region: Secondary | ICD-10-CM | POA: Diagnosis not present

## 2017-08-10 DIAGNOSIS — M4726 Other spondylosis with radiculopathy, lumbar region: Secondary | ICD-10-CM | POA: Diagnosis not present

## 2017-08-12 DIAGNOSIS — G4733 Obstructive sleep apnea (adult) (pediatric): Secondary | ICD-10-CM | POA: Diagnosis not present

## 2017-08-12 DIAGNOSIS — R0602 Shortness of breath: Secondary | ICD-10-CM | POA: Diagnosis not present

## 2017-09-15 ENCOUNTER — Ambulatory Visit: Payer: 59 | Attending: Neurological Surgery | Admitting: Physical Therapy

## 2017-09-15 DIAGNOSIS — M6281 Muscle weakness (generalized): Secondary | ICD-10-CM | POA: Diagnosis not present

## 2017-09-15 DIAGNOSIS — M25552 Pain in left hip: Secondary | ICD-10-CM | POA: Diagnosis not present

## 2017-09-15 DIAGNOSIS — R262 Difficulty in walking, not elsewhere classified: Secondary | ICD-10-CM | POA: Diagnosis not present
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
## 2017-09-17 ENCOUNTER — Ambulatory Visit: Payer: 59 | Admitting: Physical Therapy

## 2017-09-17 DIAGNOSIS — M25552 Pain in left hip: Secondary | ICD-10-CM

## 2017-09-17 DIAGNOSIS — R262 Difficulty in walking, not elsewhere classified: Secondary | ICD-10-CM

## 2017-09-17 DIAGNOSIS — M6281 Muscle weakness (generalized): Secondary | ICD-10-CM

## 2017-09-17 NOTE — Therapy (Signed)
[facility] Outpatient Rehabilitation [facility] [address]  [address] [place], [place], [address] Phone: [phone]   Fax:  [phone]  Physical Therapy Treatment  Patient Details  Name: Andrew Blevins MRN: [id number] Date of Birth: 12/15/1960 Referring Provider: Hulan Saas, MD  Encounter Date: 09/17/2017      PT End of Session - 09/17/17 1018    Visit Number 2   Number of Visits 16   Date for PT Re-Evaluation 11/10/17   Authorization Type UHC   Authorization - Number of Visits 55   PT Start Time 1018   PT Stop Time 1104   PT Time Calculation (min) 46 min   Activity Tolerance Patient tolerated treatment well   Behavior During Therapy [facility] for tasks assessed/performed      Past Medical History:  Diagnosis Date  . ED (erectile dysfunction)   . GERD (gastroesophageal reflux disease)   . History of Helicobacter pylori infection 02/2005  . Hypertension     Past Surgical History:  Procedure Laterality Date  . INGUINAL HERNIA REPAIR Left 1997  . SHOULDER SURGERY      There were no vitals filed for this visit.      Subjective Assessment - 09/17/17 1022    Subjective Pt reports burning pain in L lateral thigh that did not improve with conservative treatment, therefore underwent back surgery on 08/10/17 on L2 & L3. Burning pain improved but now experiencing L hip pain believed to be bursitis and noting some weakness in L LE. Received a steroid shot in the L hip at post-op f/u with some relief but pain not resolved.   Pertinent History 08/10/17 - L2 & L3 laminectomy (per pt report)   Patient Stated Goals "get the strength back in the leg and get rid of the pain so I can stand or walk longer"   Currently in Pain? Yes   Pain Score 7    Pain Location Hip   Pain Orientation Left;Lateral   Pain Descriptors / Indicators Throbbing   Pain Type Acute pain                         [facility] Adult PT Treatment/Exercise - 09/17/17 1018      Lumbar Exercises: Supine   Ab Set 15 reps;5 seconds   AB Set Limitations + pelvic tilt   Clam 15 reps;3 seconds   Clam Limitations alt hip ABD/ER with red TB   Bridge 15 reps;5 seconds   Bridge Limitations + hip ABD isometric with red TB     Knee/Hip Exercises: Stretches   Active Hamstring Stretch 30 seconds;Left;2 reps;Right;1 rep   Active Hamstring Stretch Limitations supine with strap   Hip Flexor Stretch 30 seconds;Left;2 reps;Right;1 rep   Hip Flexor Stretch Limitations mod thomas with strap   ITB Stretch 30 seconds;Left;2 reps;Right;1 rep   ITB Stretch Limitations supine with strap   Piriformis Stretch 30 seconds;Left;2 reps;Right;1 rep   Piriformis Stretch Limitations figure 4 with overpressure & KTOS     Knee/Hip Exercises: Aerobic   Recumbent Bike lvl 2 x 6'     Manual Therapy   Manual Therapy Soft tissue mobilization;Taping   Manual therapy comments pt in R sidelying with lower leg supported on bolster   Soft tissue mobilization STM, IASTM with roller stick, and strumming to L ITB, lateral quads and HS   [employer] Museum/gallery exhibitions officer Space L ITB - 30%  from distal to insertion to proximal to greater trochanter, 50% star over greater trochanter                  PT Short Term Goals - 09/17/17 1024      PT SHORT TERM GOAL #1   Title Independent with initial HEP    Status On-going     PT SHORT TERM GOAL #2   Title Pt will report overall pain reduction in L hip by 50%   Status On-going           PT Long Term Goals - 09/17/17 1024      PT LONG TERM GOAL #1   Title Independent with ongoing HEP   Status On-going     PT LONG TERM GOAL #2   Title Pt will demonstrate adequate proximal LE muscle flexibility to allow for functional ROM at hip   Status On-going     PT LONG TERM GOAL #3   Title L hip strength >/= 4+/5 for improved stability    Status On-going     PT LONG TERM GOAL #4   Title Pt will report ability to stand or walk  >/= 20-30 minutes w/o limitation due to L hip pain   Status On-going     PT LONG TERM GOAL #5   Title Pt able to complete normal daily household chores and job tasks w/o limitation due to L hip pain or weakness   Status On-going               Plan - 09/17/17 1024    Clinical Impression Statement Pt reporting pain unchanged today. HEP review revealed need for repeat instruction and clarification of most stretches and exercises. Initiated manual therapy to address excessive muscle tension and tightness in lateral proximal LE musculature and provided instruction in self-rolling using a roller stick or rolling pin at home. Pt inquiring about ionto patch but cert orders not yet signed by MD, therefore unable to initiate this today. Did initiate trial of taping to L ITB with star over greater trochanter to promote increased space and reduced tension - will assess response on next visit.   Rehab Potential Good   PT Treatment/Interventions Patient/family education;ADLs/Self Care Home Management;Therapeutic exercise;Therapeutic activities;Neuromuscular re-education;Manual techniques;Dry needling;Taping;Iontophoresis /ml Dexamethasone;Electrical Stimulation;Cryotherapy;Moist Heat   Consulted and Agree with Plan of Care Patient      Patient will benefit from skilled therapeutic intervention in order to improve the following deficits and impairments:  Pain, Impaired flexibility, Increased muscle spasms, Increased fascial restricitons, Decreased strength, Difficulty walking, Decreased activity tolerance  Visit Diagnosis: Pain in left hip  Difficulty in walking, not elsewhere classified  Muscle weakness (generalized)     Problem List There are no active problems to display for this patient.   Marry Guan, PT, MPT 09/17/2017, 12:16 PM  [facility] [address]  [address] [place], [place], [address] Phone: [phone]   Fax:   [phone]  Name: SHAMAN MUSCARELLA MRN: [id number] Date of Birth: 23-Oct-1961

## 2017-09-20 ENCOUNTER — Ambulatory Visit: Payer: 59 | Admitting: Physical Therapy

## 2017-09-20 DIAGNOSIS — M25552 Pain in left hip: Secondary | ICD-10-CM

## 2017-09-20 DIAGNOSIS — M6281 Muscle weakness (generalized): Secondary | ICD-10-CM

## 2017-09-20 DIAGNOSIS — R262 Difficulty in walking, not elsewhere classified: Secondary | ICD-10-CM

## 2017-09-20 NOTE — Therapy (Signed)
[facility] Outpatient Rehabilitation [facility] [address]  [address] [place], [place], [address] Phone: [phone]   Fax:  [phone]  Physical Therapy Treatment  Patient Details  Name: Andrew Blevins MRN: [id number] Date of Birth: 1961-07-10 Referring Provider: Hulan Saas, MD  Encounter Date: 09/20/2017      PT End of Session - 09/20/17 1446    Visit Number 3   Number of Visits 16   Date for PT Re-Evaluation 11/10/17   Authorization Type UHC   Authorization - Number of Visits 55   PT Start Time 1446   PT Stop Time 1532   PT Time Calculation (min) 46 min   Activity Tolerance Patient tolerated treatment well   Behavior During Therapy [facility] for tasks assessed/performed      Past Medical History:  Diagnosis Date  . ED (erectile dysfunction)   . GERD (gastroesophageal reflux disease)   . History of Helicobacter pylori infection 02/2005  . Hypertension     Past Surgical History:  Procedure Laterality Date  . INGUINAL HERNIA REPAIR Left 1997  . SHOULDER SURGERY      There were no vitals filed for this visit.      Subjective Assessment - 09/20/17 1446    Subjective Pt stating he thinks taping helped, but reports pain is about the same today.   Pertinent History 08/10/17 - L2 & L3 laminectomy (per pt report)   Patient Stated Goals "get the strength back in the leg and get rid of the pain so I can stand or walk longer"   Currently in Pain? Yes   Pain Score 7    Pain Location Hip   Pain Orientation Left;Lateral   Pain Descriptors / Indicators Throbbing   Pain Type Acute pain                         [facility] Adult PT Treatment/Exercise - 09/20/17 1446      Lumbar Exercises: Standing   Row Both;15 reps;Theraband   Theraband Level (Row) Level 3 (Green)   Row Limitations staggered stance   Shoulder Extension Both;15 reps;Theraband;Strengthening   Theraband Level (Shoulder Extension) Level 3 (Green)   Shoulder Extension  Limitations staggered stance     Lumbar Exercises: Supine   Bent Knee Raise 15 reps;3 seconds   Bent Knee Raise Limitations brace marching with green TB   Bridge 15 reps;5 seconds   Bridge Limitations + hip ABD isometric with green TB     Lumbar Exercises: Sidelying   Clam 15 reps;3 seconds   Clam Limitations L clam in R sidelying     Lumbar Exercises: Quadruped   Madcat/Old Horse 10 reps   Madcat/Old Horse Limitations 3-5" hold      Knee/Hip Exercises: Dentist 30 seconds;2 reps;Both   Quad Stretch Limitations prone RF stretch with strap & folded pillow under knee     Knee/Hip Exercises: Aerobic   Nustep lvl 4 x 6'     Modalities   Modalities Cryotherapy;Iontophoresis     Cryotherapy   Number Minutes Cryotherapy 8 Minutes   Cryotherapy Location Hip   Type of Cryotherapy Ice massage     Iontophoresis   Type of Iontophoresis Dexamethasone   Location L greater trochanter   Dose 80 mA-min, 1.0 mL   Time 4-6 hr patch (#1 of 6)                PT Education - 09/20/17  1525    Education provided Yes   Education Details Ionto patch wearing instructions, contraindications and precautions   Person(s) Educated Patient   Methods Explanation;Handout   Comprehension Verbalized understanding          PT Short Term Goals - 09/17/17 1024      PT SHORT TERM GOAL #1   Title Independent with initial HEP    Status On-going     PT SHORT TERM GOAL #2   Title Pt will report overall pain reduction in L hip by 50%   Status On-going           PT Long Term Goals - 09/17/17 1024      PT LONG TERM GOAL #1   Title Independent with ongoing HEP   Status On-going     PT LONG TERM GOAL #2   Title Pt will demonstrate adequate proximal LE muscle flexibility to allow for functional ROM at hip   Status On-going     PT LONG TERM GOAL #3   Title L hip strength >/= 4+/5 for improved stability    Status On-going     PT LONG TERM GOAL #4   Title Pt will  report ability to stand or walk >/= 20-30 minutes w/o limitation due to L hip pain   Status On-going     PT LONG TERM GOAL #5   Title Pt able to complete normal daily household chores and job tasks w/o limitation due to L hip pain or weakness   Status On-going               Plan - 09/20/17 1451    Clinical Impression Statement Pt noting benefit from taping, but no change in pain level reported. Pt denying further need for review of HEP at this time, therefore worked on progression of hip flexor stretching as well as lumbopelvic & hip strengthening with good pt tolerance other than slight discomfort with cat/camel in quadruped. Signed cert orders received, therefore 1st ionto patch applied to L greater trochanter after instructions provided regarding contraindications and precautions as well as wearing and removal instructions. Will assess response on next visit.   Rehab Potential Good   PT Treatment/Interventions Patient/family education;ADLs/Self Care Home Management;Therapeutic exercise;Therapeutic activities;Neuromuscular re-education;Manual techniques;Dry needling;Taping;Iontophoresis /ml Dexamethasone;Electrical Stimulation;Cryotherapy;Moist Heat   Consulted and Agree with Plan of Care Patient      Patient will benefit from skilled therapeutic intervention in order to improve the following deficits and impairments:  Pain, Impaired flexibility, Increased muscle spasms, Increased fascial restricitons, Decreased strength, Difficulty walking, Decreased activity tolerance  Visit Diagnosis: Pain in left hip  Difficulty in walking, not elsewhere classified  Muscle weakness (generalized)     Problem List There are no active problems to display for this patient.   Marry Guan, PT, MPT 09/20/2017, 4:57 PM  [facility] [address]  [address] [place], [place], [address] Phone: [phone]   Fax:  [phone]  Name:  Andrew Blevins MRN: [id number] Date of Birth: [birth year]

## 2017-09-20 NOTE — Patient Instructions (Signed)
IONTOPHORESIS PATIENT PRECAUTIONS & CONTRAINDICATIONS:  . Redness under one or both electrodes can occur.  This characterized by a uniform redness that usually disappears within 12 hours of treatment. . Small pinhead size blisters may result in response to the drug.  Contact your physician if the problem persists more than 24 hours. . On rare occasions, iontophoresis therapy can result in temporary skin reactions such as rash, inflammation, irritation or burns.  The skin reactions may be the result of individual sensitivity to the ionic solution used, the condition of the skin at the start of treatment, reaction to the materials in the electrodes, allergies or sensitivity to dexamethasone, or a poor connection between the patch and your skin.  Discontinue using iontophoresis if you have any of these reactions and report to your therapist. . Remove the Patch or electrodes if you have any undue sensation of pain or burning during the treatment and report discomfort to your therapist. . Tell your Therapist if you have had known adverse reactions to the application of electrical current. . Approximate treatment time is 4-6 hours.  Remove the patch after 6 hours. . The Patch can be worn during normal activity, however excessive motion where the electrodes have been placed can cause poor contact between the skin and the electrode or uneven electrical current resulting in greater risk of skin irritation. . Keep out of the reach of children.   . DO NOT use if you have a cardiac pacemaker or any other electrically sensitive implanted device. . DO NOT use if you have a known sensitivity to dexamethasone. . DO NOT use during Magnetic Resonance Imaging (MRI). . DO NOT use over broken or compromised skin (e.g. sunburn, cuts, or acne) due to the increased risk of skin reaction. . DO NOT SHAVE over the area to be treated:  To establish good contact between the Patch and the skin, excessive hair may be  clipped. . DO NOT place the Patch or electrodes on or over your eyes, directly over your heart, or brain. . DO NOT reuse the Patch or electrodes as this may cause burns to occur.  

## 2017-09-22 ENCOUNTER — Ambulatory Visit: Payer: 59

## 2017-09-22 DIAGNOSIS — M25552 Pain in left hip: Secondary | ICD-10-CM | POA: Diagnosis not present

## 2017-09-22 DIAGNOSIS — R262 Difficulty in walking, not elsewhere classified: Secondary | ICD-10-CM

## 2017-09-22 DIAGNOSIS — M6281 Muscle weakness (generalized): Secondary | ICD-10-CM

## 2017-09-22 NOTE — Therapy (Signed)
[facility] Outpatient Rehabilitation [facility] [address]  [address] [place], [place], [address] Phone: [phone]   Fax:  [phone]  Physical Therapy Treatment  Patient Details  Name: Andrew Blevins MRN: [id number] Date of Birth: [birth year] Referring Provider: Hulan Saas, MD  Encounter Date: 09/22/2017      PT End of Session - 09/22/17 1456    Visit Number 4   Number of Visits 16   Date for PT Re-Evaluation 11/10/17   Authorization Type UHC   Authorization - Number of Visits 55   PT Start Time 1447   PT Stop Time 1535   PT Time Calculation (min) 48 min   Activity Tolerance Patient tolerated treatment well   Behavior During Therapy [facility] for tasks assessed/performed      Past Medical History:  Diagnosis Date  . ED (erectile dysfunction)   . GERD (gastroesophageal reflux disease)   . History of Helicobacter pylori infection 02/2005  . Hypertension     Past Surgical History:  Procedure Laterality Date  . INGUINAL HERNIA REPAIR Left 1997  . SHOULDER SURGERY      There were no vitals filed for this visit.      Subjective Assessment - 09/22/17 1452    Subjective Pt. reporting he got short-lasting pain from ionto patch applied last visit.     Patient Stated Goals "get the strength back in the leg and get rid of the pain so I can stand or walk longer"   Currently in Pain? Yes   Pain Score 7    Pain Location Hip   Pain Orientation Left;Lateral   Pain Descriptors / Indicators Throbbing   Pain Type Acute pain                         [facility] Adult PT Treatment/Exercise - 09/22/17 1516      Lumbar Exercises: Supine   Ab Set 15 reps;5 seconds   AB Set Limitations + pelvic tilt   Clam 15 reps;3 seconds   Clam Limitations alt hip ABD/ER with green TB   Bent Knee Raise 15 reps;3 seconds   Bent Knee Raise Limitations brace marching with green TB   Bridge 15 reps;5 seconds   Bridge Limitations + hip ABD isometric with green  TB     Knee/Hip Exercises: Dentist 30 seconds;2 reps;Both   Quad Stretch Limitations prone RF stretch with strap & bolster under knee   Hip Flexor Stretch 30 seconds;Left;2 reps;Right;1 rep   Hip Flexor Stretch Limitations mod thomas with strap   ITB Stretch 30 seconds;Left;2 reps;Right;1 rep   ITB Stretch Limitations supine with strap   Piriformis Stretch 30 seconds;Left;2 reps;Right;1 rep   Piriformis Stretch Limitations Overpressure & KTOS     Knee/Hip Exercises: Aerobic   Recumbent Bike lvl 2 x 6'     Iontophoresis   Type of Iontophoresis Dexamethasone   Location L greater trochanter   Dose 80 mA-min, 1.0 mL   Time 4-6 hr patch (#2 of 6)     Manual Therapy   Manual Therapy Soft tissue mobilization   Manual therapy comments pt in R sidelying with lower leg supported on bolster   Soft tissue mobilization STM, IASTM with roller stick, and strumming to L ITB, lateral quads and HS                  PT Short Term Goals - 09/17/17 1024  PT SHORT TERM GOAL #1   Title Independent with initial HEP    Status On-going     PT SHORT TERM GOAL #2   Title Pt will report overall pain reduction in L hip by 50%   Status On-going           PT Long Term Goals - 09/17/17 1024      PT LONG TERM GOAL #1   Title Independent with ongoing HEP   Status On-going     PT LONG TERM GOAL #2   Title Pt will demonstrate adequate proximal LE muscle flexibility to allow for functional ROM at hip   Status On-going     PT LONG TERM GOAL #3   Title L hip strength >/= 4+/5 for improved stability    Status On-going     PT LONG TERM GOAL #4   Title Pt will report ability to stand or walk >/= 20-30 minutes w/o limitation due to L hip pain   Status On-going     PT LONG TERM GOAL #5   Title Pt able to complete normal daily household chores and job tasks w/o limitation due to L hip pain or weakness   Status On-going               Plan - 09/22/17 1513     Clinical Impression Statement Pt. doing well today noting some relief from ionto patch applied last visit.  Pt. reports he "gets to exercises at home most of the time".  Tolerated supine level hip strengthening activities today well without issue.  Claude still ttp at L greater trochanter today.  Some STM/strumming with roller stick to L lateral thigh musculature/ITB today with pt. tolerating well without tenderness.  Pt. encouraged to avoid painful activities as much as possible and perform HEP daily.  Ionto patch #2/6 applied to L hip to end treatment.     PT Treatment/Interventions Patient/family education;ADLs/Self Care Home Management;Therapeutic exercise;Therapeutic activities;Neuromuscular re-education;Manual techniques;Dry needling;Taping;Iontophoresis /ml Dexamethasone;Electrical Stimulation;Cryotherapy;Moist Heat      Patient will benefit from skilled therapeutic intervention in order to improve the following deficits and impairments:  Pain, Impaired flexibility, Increased muscle spasms, Increased fascial restricitons, Decreased strength, Difficulty walking, Decreased activity tolerance  Visit Diagnosis: Pain in left hip  Difficulty in walking, not elsewhere classified  Muscle weakness (generalized)     Problem List There are no active problems to display for this patient.   Kermit Balo, PTA 09/22/17 3:52 PM  [facility] Outpatient Rehabilitation [facility] [address]  [address] [place], [place], [address] Phone: [phone]   Fax:  [phone]  Name: Andrew Blevins MRN: [id number] Date of Birth: 03-Aug-1961

## 2017-09-27 ENCOUNTER — Ambulatory Visit: Payer: 59 | Admitting: Physical Therapy

## 2017-09-29 ENCOUNTER — Ambulatory Visit: Payer: 59

## 2017-09-29 DIAGNOSIS — M25552 Pain in left hip: Secondary | ICD-10-CM | POA: Diagnosis not present

## 2017-09-29 DIAGNOSIS — M6281 Muscle weakness (generalized): Secondary | ICD-10-CM

## 2017-09-29 DIAGNOSIS — R262 Difficulty in walking, not elsewhere classified: Secondary | ICD-10-CM

## 2017-09-29 NOTE — Therapy (Signed)
[facility] [place] [address]  [address] [place], [place], [address] Phone: [phone]   Fax:  [phone]  Physical Therapy Treatment  Patient Details  Name: Andrew Blevins B Rappa MRN: [id number] Date of Birth: 08/02/1961 Referring Provider: Hulan SaasBenjamin J. Ditty, MD  Encounter Date: 09/29/2017      PT End of Session - 09/29/17 0943    Visit Number 5   Number of Visits 16   Date for PT Re-Evaluation 11/10/17   Authorization Type UHC   Authorization - Number of Visits 55   PT Start Time 0931   PT Stop Time 1015   PT Time Calculation (min) 44 min   Activity Tolerance Patient tolerated treatment well   Behavior During Therapy [facility] for tasks assessed/performed      Past Medical History:  Diagnosis Date  . ED (erectile dysfunction)   . GERD (gastroesophageal reflux disease)   . History of Helicobacter pylori infection 02/2005  . Hypertension     Past Surgical History:  Procedure Laterality Date  . INGUINAL HERNIA REPAIR Left 1997  . SHOULDER SURGERY      There were no vitals filed for this visit.      Subjective Assessment - 09/29/17 0941    Subjective Marina Goodellerry noting he has had increased L hip pain since Saturday after walking to stadium for son's football game which bothers him most while walking.     Patient Stated Goals "get the strength back in the leg and get rid of the pain so I can stand or walk longer"   Currently in Pain? Yes   Pain Score 7    Pain Location Hip   Pain Orientation Left;Lateral   Pain Descriptors / Indicators Aching   Pain Type Acute pain   Pain Frequency Intermittent   Aggravating Factors  walking    Multiple Pain Sites Yes   Pain Score 8   Pain Location Calf   Pain Orientation Left   Pain Descriptors / Indicators Tightness   Pain Type Acute pain                         [facility] Adult PT Treatment/Exercise - 09/29/17 0944      Lumbar Exercises: Stretches   Single Knee to Chest  Stretch 1 rep;30 seconds   Single Knee to Chest Stretch Limitations due to report of back tightness      Lumbar Exercises: Supine   Bridge 15 reps;5 seconds   Bridge Limitations + hip ABD isometric with green TB     Lumbar Exercises: Quadruped   Straight Leg Raise 5 reps;3 seconds  orange p-ball support under abdomen    Straight Leg Raises Limitations terminated due to poor tolerance      Knee/Hip Exercises: Stretches   Quad Stretch 1 rep;60 seconds;Left   Quad Stretch Limitations prone RF stretch with strap & bolster under knee   Hip Flexor Stretch Left;60 seconds;1 rep   Hip Flexor Stretch Limitations mod thomas with strap   ITB Stretch 30 seconds;Left;2 reps;1 rep   ITB Stretch Limitations supine with strap   Piriformis Stretch 30 seconds;Left;2 reps   Piriformis Stretch Limitations KTOS, Figure-4 with strap assist    Gastroc Stretch Left;30 seconds;1 rep  due to complaint of "calf tightness"   Gastroc Stretch Limitations at wall    Soleus Stretch Left;30 seconds;1 rep  due to complaint of "calf tightness"   Soleus Stretch Limitations at wall  Knee/Hip Exercises: Aerobic   Recumbent Bike lvl 2 x 6'     Cryotherapy   Number Minutes Cryotherapy 8 Minutes   Cryotherapy Location Hip  at L GT   Type of Cryotherapy Ice massage     Iontophoresis   Type of Iontophoresis Dexamethasone   Location L greater trochanter   Dose 80 mA-min, 1.0 mL   Time 4-6 hr patch (#3 of 6)     Manual Therapy   Manual Therapy Soft tissue mobilization   Manual therapy comments pt in R sidelying with lower leg supported on bolster   Soft tissue mobilization STM, IASTM with roller stick, and strumming to L ITB, lateral quads; STM to glute; no tenderness                   PT Short Term Goals - 09/17/17 1024      PT SHORT TERM GOAL #1   Title Independent with initial HEP    Status On-going     PT SHORT TERM GOAL #2   Title Pt will report overall pain reduction in L hip by 50%    Status On-going           PT Long Term Goals - 09/17/17 1024      PT LONG TERM GOAL #1   Title Independent with ongoing HEP   Status On-going     PT LONG TERM GOAL #2   Title Pt will demonstrate adequate proximal LE muscle flexibility to allow for functional ROM at hip   Status On-going     PT LONG TERM GOAL #3   Title L hip strength >/= 4+/5 for improved stability    Status On-going     PT LONG TERM GOAL #4   Title Pt will report ability to stand or walk >/= 20-30 minutes w/o limitation due to L hip pain   Status On-going     PT LONG TERM GOAL #5   Title Pt able to complete normal daily household chores and job tasks w/o limitation due to L hip pain or weakness   Status On-going               Plan - 09/29/17 1244    Clinical Impression Statement Pt. with primary complaint of L hip pain today which has bothered him most while walking and has been worse since walking ~ 1/2 to son's football game on Saturday.  Pt. ttp at L GT and complaint of "tightness and pain" throughout L thigh musculature since flare-up on Saturday.  Treatment focusing on LE stretching and STM to decrease pain and tightness.  Pt. noting benefit from last ionto patch thus ionto patch #3/6 applied to end treatment.  Ice massage to L GT continued today for hopeful reduction in L hip irritation.  Some complaint of L calf muscle tightness today which was relieved with stretching.  Good overall relief of pain following treatment today.  Will monitor pt. response at upcoming visit.     PT Treatment/Interventions Patient/family education;ADLs/Self Care Home Management;Therapeutic exercise;Therapeutic activities;Neuromuscular re-education;Manual techniques;Dry needling;Taping;Iontophoresis 4mg /ml Dexamethasone;Electrical Stimulation;Cryotherapy;Moist Heat      Patient will benefit from skilled therapeutic intervention in order to improve the following deficits and impairments:  Pain, Impaired flexibility,  Increased muscle spasms, Increased fascial restricitons, Decreased strength, Difficulty walking, Decreased activity tolerance  Visit Diagnosis: Pain in left hip  Difficulty in walking, not elsewhere classified  Muscle weakness (generalized)     Problem List There are no active problems to display  for this patient.   Kermit Balo, PTA 09/29/17 12:57 PM  [facility] Outpatient Rehabilitation [facility] [address]  [address] [place], [place], [address] Phone: [phone]   Fax:  [phone]  Name: TYLEN LEVERICH MRN: [id number] Date of Birth: 12/29/1960

## 2017-10-01 DIAGNOSIS — M25552 Pain in left hip: Secondary | ICD-10-CM | POA: Diagnosis not present

## 2017-10-01 DIAGNOSIS — M7062 Trochanteric bursitis, left hip: Secondary | ICD-10-CM | POA: Diagnosis not present

## 2017-10-04 ENCOUNTER — Ambulatory Visit: Payer: 59 | Admitting: Physical Therapy

## 2017-10-04 DIAGNOSIS — R262 Difficulty in walking, not elsewhere classified: Secondary | ICD-10-CM

## 2017-10-04 DIAGNOSIS — M25552 Pain in left hip: Secondary | ICD-10-CM

## 2017-10-04 DIAGNOSIS — M6281 Muscle weakness (generalized): Secondary | ICD-10-CM

## 2017-10-04 NOTE — Therapy (Signed)
[facility] Outpatient Rehabilitation [facility] [address]  [address] [place], [place], [address] Phone: [phone]   Fax:  [phone]  Physical Therapy Treatment  Patient Details  Name: Andrew Blevins MRN: [id number] Date of Birth: 01/27/1961 Referring Provider: Hulan Saas, MD  Encounter Date: 10/04/2017      PT End of Session - 10/04/17 1019    Visit Number 6   Number of Visits 16   Date for PT Re-Evaluation 11/10/17   Authorization Type UHC   Authorization - Number of Visits 55   PT Start Time 1019   PT Stop Time 1115   PT Time Calculation (min) 56 min   Activity Tolerance Patient tolerated treatment well   Behavior During Therapy [facility] for tasks assessed/performed      Past Medical History:  Diagnosis Date  . ED (erectile dysfunction)   . GERD (gastroesophageal reflux disease)   . History of Helicobacter pylori infection 02/2005  . Hypertension     Past Surgical History:  Procedure Laterality Date  . INGUINAL HERNIA REPAIR Left 1997  . SHOULDER SURGERY      There were no vitals filed for this visit.      Subjective Assessment - 10/04/17 1021    Subjective Pt reports he did a lot of stretching over the weekend, including in the pool. Notes pain "a little better today".   Pertinent History 08/10/17 - L2 & L3 laminectomy (per pt report)   Patient Stated Goals "get the strength back in the leg and get rid of the pain so I can stand or walk longer"   Currently in Pain? Yes   Pain Score 6    Pain Location Hip   Pain Orientation Left;Lateral   Pain Descriptors / Indicators Throbbing   Pain Type Acute pain   Pain Frequency Intermittent   Aggravating Factors  walking    Pain Relieving Factors sitting/rest, ice   Multiple Pain Sites No            [facility] PT Assessment - 10/04/17 1019      Assessment   Medical Diagnosis L hip bursitis s/p lumbar surgery    Referring Provider Hulan Saas, MD   Onset Date/Surgical Date  08/10/17   Next MD Visit 10/06/17 with PA                     [facility] Adult PT Treatment/Exercise - 10/04/17 1019      Lumbar Exercises: Standing   Functional Squats 15 reps;5 seconds   Functional Squats Limitations TRX     Knee/Hip Exercises: Stretches   ITB Stretch Left;30 seconds;1 rep   ITB Stretch Limitations manual in supine   Piriformis Stretch Left;30 seconds;1 rep   Piriformis Stretch Limitations manual figure 4 with overpressure & KTOS     Knee/Hip Exercises: Aerobic   Recumbent Bike lvl 3 x 6'     Knee/Hip Exercises: Standing   Hip Flexion Both;10 reps;Knee straight   Hip Flexion Limitations abd bracing + red TB at ankle; 2 pole A   Hip ADduction Both;10 reps;Strengthening   Hip ADduction Limitations abd bracing + red TB at ankle; 2 pole A   Hip Abduction Both;10 reps;Knee straight;Stengthening   Abduction Limitations abd bracing + red TB at ankle; 2 pole A   Hip Extension Both;10 reps;Knee straight;Stengthening   Extension Limitations abd bracing + red TB at ankle; 2 pole A     Modalities   Modalities Electrical Stimulation;Cryotherapy  Cryotherapy   Number Minutes Cryotherapy 15 Minutes   Cryotherapy Location Hip   Type of Cryotherapy Ice pack     Electrical Stimulation   Electrical Stimulation Location L hip complex   Electrical Stimulation Action IFC   Electrical Stimulation Parameters 80-150 Hz, intensity to pt tol x15'   Electrical Stimulation Goals Pain;Tone     Manual Therapy   Manual Therapy Soft tissue mobilization;Myofascial release   Manual therapy comments pt in R sidelying with lower leg supported on bolster   Soft tissue mobilization STM, IASTM with roller stick, and strumming to L ITB, lateral quads and HS; STM & IASTM with small ball to L piriformis   Myofascial Release TPR to L piriformis                  PT Short Term Goals - 10/04/17 1025      PT SHORT TERM GOAL #1   Title Independent with initial HEP     Status Achieved     PT SHORT TERM GOAL #2   Title Pt will report overall pain reduction in L hip by 50%   Status On-going           PT Long Term Goals - 09/17/17 1024      PT LONG TERM GOAL #1   Title Independent with ongoing HEP   Status On-going     PT LONG TERM GOAL #2   Title Pt will demonstrate adequate proximal LE muscle flexibility to allow for functional ROM at hip   Status On-going     PT LONG TERM GOAL #3   Title L hip strength >/= 4+/5 for improved stability    Status On-going     PT LONG TERM GOAL #4   Title Pt will report ability to stand or walk >/= 20-30 minutes w/o limitation due to L hip pain   Status On-going     PT LONG TERM GOAL #5   Title Pt able to complete normal daily household chores and job tasks w/o limitation due to L hip pain or weakness   Status On-going               Plan - 10/04/17 1025    Clinical Impression Statement Pt reporting good compliance with HEP and stretches and is noting pain "a little better" since starting PT. Continued tightness persists in all hip musculature with TPs noted in L piriformis, therefore addressed this with manual therapy & stretching. Initiated standing hip strengthening with good tolerance and completed visit with trial of IFC estim at pt request at pt noted this worked well for him back when he played football.   Rehab Potential Good   PT Treatment/Interventions Patient/family education;ADLs/Self Care Home Management;Therapeutic exercise;Therapeutic activities;Neuromuscular re-education;Manual techniques;Dry needling;Taping;Iontophoresis 4mg /ml Dexamethasone;Electrical Stimulation;Cryotherapy;Moist Heat   PT Next Visit Plan MD note for appt Wed pm   Consulted and Agree with Plan of Care Patient      Patient will benefit from skilled therapeutic intervention in order to improve the following deficits and impairments:  Pain, Impaired flexibility, Increased muscle spasms, Increased fascial restricitons,  Decreased strength, Difficulty walking, Decreased activity tolerance  Visit Diagnosis: Pain in left hip  Difficulty in walking, not elsewhere classified  Muscle weakness (generalized)     Problem List There are no active problems to display for this patient.   Marry Guan, PT, MPT 10/04/2017, 11:48 AM  [facility] Outpatient Rehabilitation [facility] [address]  [address] [place],  [place], [address] Phone: [phone]   Fax:  [phone]  Name: Andrew Blevins MRN: [id number] Date of Birth: 08/28/1961

## 2017-10-06 ENCOUNTER — Ambulatory Visit: Payer: 59

## 2017-10-06 DIAGNOSIS — M6281 Muscle weakness (generalized): Secondary | ICD-10-CM

## 2017-10-06 DIAGNOSIS — M25552 Pain in left hip: Secondary | ICD-10-CM

## 2017-10-06 DIAGNOSIS — R262 Difficulty in walking, not elsewhere classified: Secondary | ICD-10-CM

## 2017-10-06 NOTE — Therapy (Signed)
[facility] [address]  [address] [place], [place], [address] Phone: [phone]   Fax:  [phone]  Physical Therapy Treatment  Patient Details  Name: Andrew Blevins MRN: [id number] Date of Birth: February 01, 1961 Referring Provider: Hulan Saas, MD  Encounter Date: 10/06/2017      PT End of Session - 10/06/17 1101    Visit Number 7   Number of Visits 16   Date for PT Re-Evaluation 11/10/17   Authorization Type UHC   Authorization - Number of Visits 55   PT Start Time 1101   PT Stop Time 1203  10 min ice pack to end tx   PT Time Calculation (min) 62 min   Activity Tolerance Patient tolerated treatment well   Behavior During Therapy [facility] for tasks assessed/performed      Past Medical History:  Diagnosis Date  . ED (erectile dysfunction)   . GERD (gastroesophageal reflux disease)   . History of Helicobacter pylori infection 02/2005  . Hypertension     Past Surgical History:  Procedure Laterality Date  . INGUINAL HERNIA REPAIR Left 1997  . SHOULDER SURGERY      There were no vitals filed for this visit.      Subjective Assessment - 10/06/17 1104    Subjective Andrew Blevins reporting he felt ok after last visit.  Still with L hip pain with prolonged standing and walking.     Patient Stated Goals "get the strength back in the leg and get rid of the pain so I can stand or walk longer"   Currently in Pain? Yes   Pain Score 6    Pain Location Hip   Pain Orientation Left;Lateral   Pain Descriptors / Indicators Throbbing   Pain Type Acute pain   Aggravating Factors  prolonged standing and walking    Multiple Pain Sites No            [facility] PT Assessment - 10/06/17 1142      Strength   Strength Assessment Site Hip;Knee   Right/Left Hip Right;Left   Right Hip Flexion 4+/5   Right Hip Extension 3-/5   Right Hip External Rotation  4+/5   Right Hip Internal Rotation 4+/5   Right Hip ABduction 4+/5   Right Hip  ADduction 4-/5   Left Hip Flexion 4-/5   Left Hip Extension 3-/5   Left Hip External Rotation 4/5   Left Hip Internal Rotation 4/5   Left Hip ABduction 4-/5   Left Hip ADduction 4/5   Right/Left Knee Right;Left   Right Knee Flexion 4+/5   Right Knee Extension 5/5   Left Knee Flexion 4/5   Left Knee Extension 4+/5                     [facility] Adult PT Treatment/Exercise - 10/06/17 1107      Lumbar Exercises: Standing   Functional Squats 15 reps;5 seconds   Functional Squats Limitations at counter      Knee/Hip Exercises: Stretches   ITB Stretch Left;30 seconds;1 rep   ITB Stretch Limitations manual in supine   Piriformis Stretch Left;30 seconds;1 rep   Piriformis Stretch Limitations manual figure 4 with overpressure & KTOS     Knee/Hip Exercises: Aerobic   Recumbent Bike lvl 3 x 6'     Knee/Hip Exercises: Standing   Hip Flexion 10 reps;Knee straight;Left  Only performed on L LE due to pain with R SLR    Hip  Flexion Limitations abd bracing + red TB at ankle; 2 pole A   Hip ADduction 10 reps;Strengthening;Left  Only performed on L LE due to pain with R SLR   Hip ADduction Limitations abd bracing + red TB at ankle; 2 pole A   Hip Abduction 10 reps;Knee straight;Stengthening;Left  only performed on L LE due to pain with R SLR   Abduction Limitations abd bracing + red TB at ankle; 2 pole A   Hip Extension 10 reps;Knee straight;Stengthening;Left  on performed on L due to pain with R SLR    Extension Limitations abd bracing + red TB at ankle; 2 pole A     Cryotherapy   Number Minutes Cryotherapy 10 Minutes   Cryotherapy Location Hip  L   Type of Cryotherapy Ice pack     Iontophoresis   Type of Iontophoresis Dexamethasone   Location L greater trochanter   Dose 80 mA-min, 1.0 mL   Time 4-6 hr patch (#4 of 6)     Manual Therapy   Manual Therapy Myofascial release   Manual therapy comments pt in R sidelying with lower leg supported on bolster   Myofascial  Release TPR to L piriformis                   PT Short Term Goals - 10/06/17 1121      PT SHORT TERM GOAL #1   Title Independent with initial HEP    Status Achieved     PT SHORT TERM GOAL #2   Title Pt will report overall pain reduction in L hip by 50%   Status On-going  reporting 30% reduction            PT Long Term Goals - 10/06/17 1122      PT LONG TERM GOAL #1   Title Independent with ongoing HEP   Status On-going     PT LONG TERM GOAL #2   Title Pt will demonstrate adequate proximal LE muscle flexibility to allow for functional ROM at hip   Status On-going     PT LONG TERM GOAL #3   Title L hip strength >/= 4+/5 for improved stability    Status On-going     PT LONG TERM GOAL #4   Title Pt will report ability to stand or walk >/= 20-30 minutes w/o limitation due to L hip pain   Status On-going  Reports he needs to sit due to L hip pain following 5 min standing.       PT LONG TERM GOAL #5   Title Pt able to complete normal daily household chores and job tasks w/o limitation due to L hip pain or weakness   Status On-going               Plan - 10/06/17 1107    Clinical Impression Statement Andrew Blevins reporting he feels L hip pain has improved by 30% since starting therapy during tasks such as walking and prolonged standing.  Pt. still noting L hip pain worst with walking and standing activities.  Andrew Blevins continues with pervasive LE tightness however continues to report consistent home stretching.  Demonstrating limited tolerance for L SLS positions with strengthening activities today however seems to be responding well to other standing activities.  Some strength improvement today however still grossly 4/5 strength at L hip with greatest weakness still in hip extensors.  Pt. ttp at L GT today and reports some benefit from iontophoresis patch thus patch #4/6 applied to  end treatment today.  Pt. will be walking into stadium on Saturday for son's football game,  which was the activity pt. believes was responsible for last "flare-up" of hip pain.  Will monitor response at upcoming visit.     PT Treatment/Interventions Patient/family education;ADLs/Self Care Home Management;Therapeutic exercise;Therapeutic activities;Neuromuscular re-education;Manual techniques;Dry needling;Taping;Iontophoresis 4mg /ml Dexamethasone;Electrical Stimulation;Cryotherapy;Moist Heat      Patient will benefit from skilled therapeutic intervention in order to improve the following deficits and impairments:  Pain, Impaired flexibility, Increased muscle spasms, Increased fascial restricitons, Decreased strength, Difficulty walking, Decreased activity tolerance  Visit Diagnosis: Pain in left hip  Difficulty in walking, not elsewhere classified  Muscle weakness (generalized)     Problem List There are no active problems to display for this patient.   Kermit BaloMicah Demtrius Rounds, PTA 10/06/17 1:43 PM  [facility] [place] [address]  [address] [place], [place], [address] Phone: [phone]   Fax:  [phone]  Name: Wanda Plumperry B Cyran MRN: [id number] Date of Birth: 11/01/1961

## 2017-10-08 DIAGNOSIS — R252 Cramp and spasm: Secondary | ICD-10-CM | POA: Diagnosis not present

## 2017-10-11 ENCOUNTER — Ambulatory Visit: Payer: 59 | Admitting: Physical Therapy

## 2017-10-12 ENCOUNTER — Ambulatory Visit: Payer: 59

## 2017-10-12 DIAGNOSIS — R262 Difficulty in walking, not elsewhere classified: Secondary | ICD-10-CM

## 2017-10-12 DIAGNOSIS — M25552 Pain in left hip: Secondary | ICD-10-CM

## 2017-10-12 DIAGNOSIS — M6281 Muscle weakness (generalized): Secondary | ICD-10-CM

## 2017-10-12 NOTE — Therapy (Signed)
[facility] Outpatient Rehabilitation [facility] [address]  [address] [place], [place], [address] Phone: [phone]   Fax:  [phone]  Physical Therapy Treatment  Patient Details  Name: RASTUS BORTON MRN: [id number] Date of Birth: Jun 02, 1961 Referring Provider: Hulan Saas, MD  Encounter Date: 10/12/2017      PT End of Session - 10/12/17 1031    Visit Number 8   Number of Visits 16   Date for PT Re-Evaluation 11/10/17   Authorization Type UHC   Authorization - Number of Visits 55   PT Start Time 1025  pt. arrived late    PT Stop Time 1107  10 min ice to end tx   PT Time Calculation (min) 42 min   Activity Tolerance Patient tolerated treatment well   Behavior During Therapy [facility] for tasks assessed/performed      Past Medical History:  Diagnosis Date  . ED (erectile dysfunction)   . GERD (gastroesophageal reflux disease)   . History of Helicobacter pylori infection 02/2005  . Hypertension     Past Surgical History:  Procedure Laterality Date  . INGUINAL HERNIA REPAIR Left 1997  . SHOULDER SURGERY      There were no vitals filed for this visit.      Subjective Assessment - 10/12/17 1027    Subjective Kona reporting his hip pain has been less over the weekend.     Patient Stated Goals "get the strength back in the leg and get rid of the pain so I can stand or walk longer"   Currently in Pain? Yes   Pain Score 6    Pain Location Hip   Pain Orientation Left;Lateral   Pain Descriptors / Indicators Nagging   Pain Type Acute pain   Aggravating Factors  prolonged standing, walking   Pain Relieving Factors sitting/rest, ice                          [facility] Adult PT Treatment/Exercise - 10/12/17 1033      Knee/Hip Exercises: Stretches   Hip Flexor Stretch 2 reps;Left;30 seconds   Hip Flexor Stretch Limitations standing on chair      Knee/Hip Exercises: Aerobic   Recumbent Bike lvl 4 x 6'     Knee/Hip Exercises:  Standing   Forward Lunges Right;Left;10 reps;3 seconds   Forward Lunges Limitations mini lunge on TRX   Other Standing Knee Exercises Forward/backward monster walk, side-stepping with red TB at ankles 2 x 20 ft each way   at counter      Knee/Hip Exercises: Seated   Other Seated Knee/Hip Exercises Fitter L leg press (1 black, 1 blue band) x 10 reps    Hamstring Curl Left;10 reps   Hamstring Limitations green TB    Sit to Sand 10 reps;without UE support  slow eccentric lowering      Knee/Hip Exercises: Sidelying   Clams R sidelying L clam shell with yellow TB at knees x 10 reps      Cryotherapy   Number Minutes Cryotherapy 10 Minutes   Cryotherapy Location Hip  L   Type of Cryotherapy Ice pack     Manual Therapy   Manual Therapy Myofascial release   Manual therapy comments pt in R sidelying with lower leg supported on bolster   Soft tissue mobilization STM L lateral quads (in mod thomas); STM with small ball to L piriformis over area of reported tenderness    Myofascial Release  TPR to L piriformis   pt. reporting relief with this                 PT Education - 10/12/17 1127    Education provided Yes   Education Details sit<>stand without hands, sidelying clam shell with yellow TB issued to pt.   Person(s) Educated Patient   Methods Explanation;Demonstration;Verbal cues;Handout   Comprehension Verbalized understanding;Returned demonstration;Verbal cues required;Need further instruction          PT Short Term Goals - 10/06/17 1121      PT SHORT TERM GOAL #1   Title Independent with initial HEP    Status Achieved     PT SHORT TERM GOAL #2   Title Pt will report overall pain reduction in L hip by 50%   Status On-going  reporting 30% reduction            PT Long Term Goals - 10/06/17 1122      PT LONG TERM GOAL #1   Title Independent with ongoing HEP   Status On-going     PT LONG TERM GOAL #2   Title Pt will demonstrate adequate proximal LE muscle  flexibility to allow for functional ROM at hip   Status On-going     PT LONG TERM GOAL #3   Title L hip strength >/= 4+/5 for improved stability    Status On-going     PT LONG TERM GOAL #4   Title Pt will report ability to stand or walk >/= 20-30 minutes w/o limitation due to L hip pain   Status On-going  Reports he needs to sit due to L hip pain following 5 min standing.       PT LONG TERM GOAL #5   Title Pt able to complete normal daily household chores and job tasks w/o limitation due to L hip pain or weakness   Status On-going               Plan - 10/12/17 1030    Clinical Impression Statement Pt. arrived late to treatment thus session time limited.  Pt. reporting benefit from ionto patch however only mild point tenderness at L GT today thus ionto deferred.  [name] with good tolerance for advancement of standing LE strengthening activities today.  Reports good benefit from "massage" to buttocks performed last visit and still with some tenderness in L glute, thus TPR performed over area of tenderness with good tolerance.  HEP updated to include additional strengthening activities today with yellow TB issued to pt. for sidelying clamshell.  Karlis ending treatment pain free however requesting ice pack to hip therefore ended with this.  Will monitor response to therex advancement in coming visits.     PT Treatment/Interventions Patient/family education;ADLs/Self Care Home Management;Therapeutic exercise;Therapeutic activities;Neuromuscular re-education;Manual techniques;Dry needling;Taping;Iontophoresis 4mg /ml Dexamethasone;Electrical Stimulation;Cryotherapy;Moist Heat      Patient will benefit from skilled therapeutic intervention in order to improve the following deficits and impairments:  Pain, Impaired flexibility, Increased muscle spasms, Increased fascial restricitons, Decreased strength, Difficulty walking, Decreased activity tolerance  Visit Diagnosis: Pain in left  hip  Difficulty in walking, not elsewhere classified  Muscle weakness (generalized)     Problem List There are no active problems to display for this patient.   Kermit Balo, PTA 10/12/17 2:38 PM  [facility] [address]  [address] [place], [place], [address] Phone: [phone]   Fax:  [phone]  Name: KELLI EGOLF MRN: [id number] Date of  Birth: 10/06/1961

## 2017-10-13 ENCOUNTER — Ambulatory Visit: Payer: 59

## 2017-10-13 DIAGNOSIS — I10 Essential (primary) hypertension: Secondary | ICD-10-CM | POA: Diagnosis not present

## 2017-10-13 DIAGNOSIS — Z125 Encounter for screening for malignant neoplasm of prostate: Secondary | ICD-10-CM | POA: Diagnosis not present

## 2017-10-13 DIAGNOSIS — E782 Mixed hyperlipidemia: Secondary | ICD-10-CM | POA: Diagnosis not present

## 2017-10-13 DIAGNOSIS — G4733 Obstructive sleep apnea (adult) (pediatric): Secondary | ICD-10-CM | POA: Diagnosis not present

## 2017-10-14 ENCOUNTER — Ambulatory Visit: Payer: 59 | Attending: Neurological Surgery

## 2017-10-14 DIAGNOSIS — R262 Difficulty in walking, not elsewhere classified: Secondary | ICD-10-CM

## 2017-10-14 DIAGNOSIS — M6281 Muscle weakness (generalized): Secondary | ICD-10-CM | POA: Diagnosis present

## 2017-10-14 DIAGNOSIS — M25552 Pain in left hip: Secondary | ICD-10-CM | POA: Diagnosis not present

## 2017-10-15 NOTE — Therapy (Signed)
[facility] [address]  [address] [place], [place], [address] Phone: [phone]   Fax:  [phone]  Physical Therapy Treatment  Patient Details  Name: Andrew Blevins MRN: [id number] Date of Birth: 11/28/1961 Referring Provider: Hulan SaasBenjamin J. Ditty, MD  Encounter Date: 10/14/2017      PT End of Session - 10/14/17 1322    Visit Number 9   Number of Visits 16   Date for PT Re-Evaluation 11/10/17   Authorization Type UHC   Authorization - Number of Visits 55   PT Start Time 1316   PT Stop Time 1410  10 min moist heat to end tx   PT Time Calculation (min) 54 min   Activity Tolerance Patient tolerated treatment well   Behavior During Therapy [facility] for tasks assessed/performed      Past Medical History:  Diagnosis Date  . ED (erectile dysfunction)   . GERD (gastroesophageal reflux disease)   . History of Helicobacter pylori infection 02/2005  . Hypertension     Past Surgical History:  Procedure Laterality Date  . INGUINAL HERNIA REPAIR Left 1997  . SHOULDER SURGERY      There were no vitals filed for this visit.      Subjective Assessment - 10/14/17 1319    Subjective Sharief reporting pain is more in buttocks today.  Reporting he feels an overall pain reduction in L hip of 40% since starting therapy.     How long can you sit comfortably? "It doesn't bother me sitting now"   How long can you stand comfortably? 10   How long can you walk comfortably? 60 yards    Patient Stated Goals "get the strength back in the leg and get rid of the pain so I can stand or walk longer"   Currently in Pain? Yes   Pain Score 5    Pain Location Buttocks   Pain Orientation Left;Posterior;Lateral   Pain Descriptors / Indicators Nagging   Pain Type Acute pain   Pain Onset More than a month ago   Pain Frequency Intermittent   Aggravating Factors  standing and walking    Pain Relieving Factors sitting/rest, ice    Multiple Pain Sites  No                         [facility] Adult PT Treatment/Exercise - 10/14/17 1326      Lumbar Exercises: Standing   Functional Squats 15 reps;5 seconds   Functional Squats Limitations TRX    Side Lunge 10 reps;3 seconds  on TRX     Knee/Hip Exercises: Stretches   Hip Flexor Stretch 2 reps;Left;30 seconds   Hip Flexor Stretch Limitations standing on chair    Piriformis Stretch Left;30 seconds;1 rep   Piriformis Stretch Limitations manual figure 4 with overpressure & KTOS     Knee/Hip Exercises: Aerobic   Recumbent Bike lvl 4 x 6'     Knee/Hip Exercises: Standing   Hip Flexion Right;Left;10 reps;Knee straight   Hip Flexion Limitations 2#; holding onto chair    Hip Abduction Right;Left;10 reps;Knee straight   Abduction Limitations 2#; holding onto chair    Hip Extension Left;Right;Stengthening;10 reps;Knee straight   Extension Limitations 2#; holding onto chair    Functional Squat --   Other Standing Knee Exercises side stepping with red TB at ankles x 20 ft  terminated due to onset of L hip pain     Knee/Hip Exercises: Sidelying  Clams R sidelying L clam shell with yellow TB at knees x 15 reps   verbal cueing required to prevent trunk rotation     Modalities   Modalities Moist Heat     Moist Heat Therapy   Number Minutes Moist Heat 10 Minutes   Moist Heat Location --  R buttocks muscle      Manual Therapy   Manual Therapy Myofascial release   Manual therapy comments pt in R sidelying with lower leg supported on bolster   Soft tissue mobilization DTM to L glute complex    Myofascial Release TPR to L piriformis; palpable TP  aggressive TPR with pt. tolerating well                   PT Short Term Goals - 10/14/17 1322      PT SHORT TERM GOAL #1   Title Independent with initial HEP    Status Achieved     PT SHORT TERM GOAL #2   Title Pt will report overall pain reduction in L hip by 50%   Status On-going  reporting 40% reduction             PT Long Term Goals - 10/06/17 1122      PT LONG TERM GOAL #1   Title Independent with ongoing HEP   Status On-going     PT LONG TERM GOAL #2   Title Pt will demonstrate adequate proximal LE muscle flexibility to allow for functional ROM at hip   Status On-going     PT LONG TERM GOAL #3   Title L hip strength >/= 4+/5 for improved stability    Status On-going     PT LONG TERM GOAL #4   Title Pt will report ability to stand or walk >/= 20-30 minutes w/o limitation due to L hip pain   Status On-going  Reports he needs to sit due to L hip pain following 5 min standing.       PT LONG TERM GOAL #5   Title Pt able to complete normal daily household chores and job tasks w/o limitation due to L hip pain or weakness   Status On-going               Plan - 10/14/17 1356    Clinical Impression Statement Pt. describing continued pain with walking however description of L hip pain in L buttocks/piriformis muscle today with only slight TTP of greater trochanter.  Focused DTM and TPR performed to L glute complex/piriformis today for hopeful reduction of reported pain while walking.  Pt. tolerated progression of standing LE strengthening activities well today.  Only pain reproduced at L hip in treatment was with red TB resisted side stepping thus this activity terminated.  Pt. continues with general tightness in proximal hip/LE musculature however reports consistent stretching with home HEP.  Ended treatment with trial of moist heat to L buttocks for hopeful decrease in tone/tightness in this musculature.  Andrew Blevins nearly meeting last STG today reporting 40% reduction in L hip pain since starting therapy.   PT Treatment/Interventions Patient/family education;ADLs/Self Care Home Management;Therapeutic exercise;Therapeutic activities;Neuromuscular re-education;Manual techniques;Dry needling;Taping;Iontophoresis 4mg /ml Dexamethasone;Electrical Stimulation;Cryotherapy;Moist Heat       Patient will benefit from skilled therapeutic intervention in order to improve the following deficits and impairments:  Pain, Impaired flexibility, Increased muscle spasms, Increased fascial restricitons, Decreased strength, Difficulty walking, Decreased activity tolerance  Visit Diagnosis: Pain in left hip  Difficulty in walking, not elsewhere classified  Muscle weakness (  generalized)     Problem List There are no active problems to display for this patient.   Kermit Balo, PTA 10/15/2017, 8:40 AM  [facility] [address]  [address] [place], [place], [address] Phone: [phone]   Fax:  [phone]  Name: Andrew Blevins MRN: [id number] Date of Birth: 07-31-1961

## 2017-10-18 ENCOUNTER — Ambulatory Visit: Payer: 59

## 2017-10-18 DIAGNOSIS — M6281 Muscle weakness (generalized): Secondary | ICD-10-CM

## 2017-10-18 DIAGNOSIS — M25552 Pain in left hip: Secondary | ICD-10-CM | POA: Diagnosis not present

## 2017-10-18 DIAGNOSIS — R262 Difficulty in walking, not elsewhere classified: Secondary | ICD-10-CM

## 2017-10-18 NOTE — Therapy (Addendum)
[facility] [address]  [address] [place], [place], [address] Phone: [phone]   Fax:  [phone]  Physical Therapy Treatment  Patient Details  Name: Andrew Plumperry B Lizak MRN: [id number] Date of Birth: 01/19/1961 Referring Provider: Hulan SaasBenjamin J. Ditty, MD   Encounter Date: 10/18/2017  PT End of Session - 10/18/17 1325    Visit Number  10    Number of Visits  16    Date for PT Re-Evaluation  11/10/17    Authorization Type  UHC    Authorization - Number of Visits  55    PT Start Time  1318    PT Stop Time  1430 10 min ice pack to end treatment   10 min ice pack to end treatment   PT Time Calculation (min)  72 min    Activity Tolerance  Patient tolerated treatment well    Behavior During Therapy  [facility] for tasks assessed/performed       Past Medical History:  Diagnosis Date  . ED (erectile dysfunction)   . GERD (gastroesophageal reflux disease)   . History of Helicobacter pylori infection 02/2005  . Hypertension     Past Surgical History:  Procedure Laterality Date  . INGUINAL HERNIA REPAIR Left 1997  . SHOULDER SURGERY      There were no vitals filed for this visit.  Subjective Assessment - 10/18/17 1322    Subjective  Marina Goodellerry reports doing a lot of walking over [employer] around campus.      Patient Stated Goals  "get the strength back in the leg and get rid of the pain so I can stand or walk longer"    Currently in Pain?  No/denies    Pain Score  0-No pain up to 6/10 while walking > 100 ft    up to 6/10 while walking > 100 ft    Pain Location  Hip    Pain Orientation  Left;Lateral    Pain Type  Acute pain         [facility] PT Assessment - 10/18/17 1339      Observation/Other Assessments   Focus on Therapeutic Outcomes (FOTO)   49% (51% limitation)      Strength   Strength Assessment Site  Hip;Knee    Right/Left Hip  Right;Left    Right Hip Flexion  4+/5    Right Hip Extension  3-/5    Right Hip External  Rotation   4+/5    Right Hip Internal Rotation  4+/5    Right Hip ABduction  4+/5    Right Hip ADduction  4-/5    Left Hip Flexion  4-/5    Left Hip Extension  3-/5    Left Hip External Rotation  4/5    Left Hip Internal Rotation  4/5    Left Hip ABduction  4-/5    Left Hip ADduction  4/5    Right/Left Knee  Right;Left    Right Knee Flexion  4+/5    Right Knee Extension  5/5    Left Knee Flexion  4+/5    Left Knee Extension  4+/5                  [facility] Adult PT Treatment/Exercise - 10/18/17 1408      Self-Care   Self-Care  Other Self-Care Comments    Other Self-Care Comments   Instruction on self massage with foam roller and massage "stick" to B VL/ITB as pt. still  with palpable tightness here; Supervising PT discussing with pt. his status in regards to progress toward goals       Knee/Hip Exercises: Stretches   Piriformis Stretch  Left;30 seconds;1 rep    Piriformis Stretch Limitations  seated figure-4, mod piri    Gastroc Stretch  Left;Right;30 seconds;1 rep    Gastroc Stretch Limitations  leaning into wall     Soleus Stretch  Left;Right;30 seconds;1 rep    Soleus Stretch Limitations  leaning into wall     Other Knee/Hip Stretches  Foam rolling to L VL/ITB, and L glute x 1 min with pt. tolerating well       Knee/Hip Exercises: Aerobic   Recumbent Bike  lvl 4 x 6'      Knee/Hip Exercises: Standing   Hip Flexion  Right;Left;10 reps;Knee straight    Hip Flexion Limitations  red TB; 2 ski poles     Hip ADduction  10 reps;Strengthening;Left R terminated x 5 reps due to L hip pain    R terminated x 5 reps due to L hip pain    Hip ADduction Limitations  red TB; 2 ski poles red TB; 2 ski poles     Hip Abduction  Right;Left;10 reps;Knee straight R terminated x 5 reps due to L hip pain    R terminated x 5 reps due to L hip pain    Abduction Limitations  red TB; 2 ski poles     Hip Extension  Left;Right;Stengthening;10 reps;Knee straight R terminated after x 5 reps due  to L hip pain    R terminated after x 5 reps due to L hip pain    Extension Limitations  red TB; 2 ski poles       Cryotherapy   Number Minutes Cryotherapy  10 Minutes    Cryotherapy Location  Hip    Type of Cryotherapy  Ice pack      Iontophoresis   Type of Iontophoresis  Dexamethasone    Location  L greater trochanter    Dose  80 mA-min, 1.0 mL    Time  4-6 hr patch (#5 of 6)      Manual Therapy   Manual Therapy  Soft tissue mobilization    Soft tissue mobilization  STM/strumming to L VL/ITB; pt. still with palpable tightness              PT Education - 10/18/17 1442    Education provided  Yes    Education Details  glute, quad/ITB foam rolling, roller stick massage to quad/ITB, gastroc stretch on wall     Person(s) Educated  Patient    Methods  Explanation;Demonstration;Verbal cues;Handout    Comprehension  Verbalized understanding;Returned demonstration;Verbal cues required;Need further instruction       PT Short Term Goals - 10/18/17 1327      PT SHORT TERM GOAL #1   Title  Independent with initial HEP     Status  Achieved      PT SHORT TERM GOAL #2   Title  Pt will report overall pain reduction in L hip by 50%    Status  On-going reporting 40% reduction    reporting 40% reduction        PT Long Term Goals - 10/18/17 1337      PT LONG TERM GOAL #1   Title  Independent with ongoing HEP    Status  On-going      PT LONG TERM GOAL #2   Title  Pt will demonstrate  adequate proximal LE muscle flexibility to allow for functional ROM at hip    Status  On-going      PT LONG TERM GOAL #3   Title  L hip strength >/= 4+/5 for improved stability     Status  On-going      PT LONG TERM GOAL #4   Title  Pt will report ability to stand or walk >/= 20-30 minutes w/o limitation due to L hip pain    Status  On-going Reports he needs to sit due to L hip pain following 5 min standing.     Reports he needs to sit due to L hip pain following 5 min standing.       PT  LONG TERM GOAL #5   Title  Pt able to complete normal daily household chores and job tasks w/o limitation due to L hip pain or weakness    Status  On-going Pt. reporting still limited with mowing yard.     Pt. reporting still limited with mowing yard.             Plan - 10/18/17 1348    Clinical Impression Statement  Pt. seen to start treatment reporting he "walked a lot during [employer] on Saturday".  Pt. reporting he is still having pain at L hip with walking over short distances ~ 128ft.  Pt. reporting 40% improvement in L hip pain levels since starting therapy.  Supervising PT discussing with pt. current progress toward goals and his ongoing LE tightness in lateral quads and ITB as it may be closely related to his current pain levels while walking and slow progress with therapy thus far.  Pt. instructed in foam roller to lateral thigh musculature and self-massage with "roller stick" as pt. has purchased this for home use.  HEP updated with handout for these activities for hopeful improvement in LE tissue quality.  Ole still with limited tolerance for standing strengthening activities however, this may be attributable to recent walking over weekend.  Will monitor response to updated HEP at next visit.      PT Treatment/Interventions  Patient/family education;ADLs/Self Care Home Management;Therapeutic exercise;Therapeutic activities;Neuromuscular re-education;Manual techniques;Dry needling;Taping;Iontophoresis 4mg /ml Dexamethasone;Electrical Stimulation;Cryotherapy;Moist Heat       Patient will benefit from skilled therapeutic intervention in order to improve the following deficits and impairments:  Pain, Impaired flexibility, Increased muscle spasms, Increased fascial restricitons, Decreased strength, Difficulty walking, Decreased activity tolerance  Visit Diagnosis: Pain in left hip  Difficulty in walking, not elsewhere classified  Muscle weakness (generalized)     Problem  List There are no active problems to display for this patient.   Kermit Balo, PTA 10/18/17 6:30 PM  [facility] [address]  [address] [place], [place], [address] Phone: [phone]   Fax:  [phone]  Name: YOUSIF EDELSON MRN: [id number] Date of Birth: November 07, 1961

## 2017-10-20 ENCOUNTER — Ambulatory Visit: Payer: 59

## 2017-10-20 DIAGNOSIS — M25552 Pain in left hip: Secondary | ICD-10-CM

## 2017-10-20 DIAGNOSIS — M6281 Muscle weakness (generalized): Secondary | ICD-10-CM

## 2017-10-20 DIAGNOSIS — R262 Difficulty in walking, not elsewhere classified: Secondary | ICD-10-CM

## 2017-10-20 NOTE — Therapy (Signed)
[facility] [address]  [address] [place], [place], [address] Phone: [phone]   Fax:  [phone]  Physical Therapy Treatment  Patient Details  Name: Andrew Blevins MRN: [id number] Date of Birth: Apr 28, 1961 Referring Provider: Hulan Saas, MD   Encounter Date: 10/20/2017  PT End of Session - 10/20/17 1016    Visit Number  11    Number of Visits  16    Date for PT Re-Evaluation  11/10/17    Authorization Type  UHC    Authorization - Number of Visits  55    PT Start Time  1016    PT Stop Time  1111    PT Time Calculation (min)  55 min    Activity Tolerance  Patient tolerated treatment well    Behavior During Therapy  [facility] for tasks assessed/performed       Past Medical History:  Diagnosis Date  . ED (erectile dysfunction)   . GERD (gastroesophageal reflux disease)   . History of Helicobacter pylori infection 02/2005  . Hypertension     Past Surgical History:  Procedure Laterality Date  . INGUINAL HERNIA REPAIR Left 1997  . SHOULDER SURGERY      There were no vitals filed for this visit.  Subjective Assessment - 10/20/17 1015    Subjective  Andrew Blevins reports hip has been feeling good today and felt good following last visit.      Pertinent History  08/10/17 - L2 & L3 laminectomy (per pt report)    How long can you stand comfortably?  15    Patient Stated Goals  "get the strength back in the leg and get rid of the pain so I can stand or walk longer"    Currently in Pain?  No/denies    Pain Score  0-No pain    Multiple Pain Sites  No                      [facility] Adult PT Treatment/Exercise - 10/20/17 1027      Ambulation/Gait   Ambulation/Gait  Yes    Ambulation Distance (Feet)  330 Feet    Assistive device  None    Gait Comments  Cueing for upright posture; able to ambulate 343ft before onset of hip pain quickly relieved following sitting rest break       Lumbar Exercises: Quadruped   Straight Leg Raise  3 seconds;10 reps    Straight Leg Raises Limitations  peanut p-ball; good tolerance       Knee/Hip Exercises: Stretches   Hip Flexor Stretch  2 reps;Left;30 seconds added to HEP   added to HEP   Hip Flexor Stretch Limitations  standing on chair     Other Knee/Hip Stretches  Foam rolling to L VL/ITB, and L glute x 1 min with pt. tolerating well  cueing required for technique    cueing required for technique      Knee/Hip Exercises: Standing   Side Lunges  Right;Left;3 seconds;10 reps    Side Lunges Limitations  TRX; cueing to control speed of movement and avoid "bouncing"    Forward Step Up  Left;10 reps;Step Height: 6";1 set;Hand Hold: 1    Forward Step Up Limitations  1 light UE support on chair     Other Standing Knee Exercises  Standing alternating hip flexion (to 12" box), abduction (to 9" stool) 3# toe-clears x 10 reps each way; at counter  Knee/Hip Exercises: Seated   Sit to Sand  without UE support;15 reps cueing to avoid knees "caving in"; from 17" mat table    cueing to avoid knees "caving in"; from 17" mat table      Knee/Hip Exercises: Sidelying   Clams  R sidelying L clam shell with yellow TB at knees x 15 reps  cueing x 1 to prevent trunk twist    cueing x 1 to prevent trunk twist      Cryotherapy   Number Minutes Cryotherapy  10 Minutes    Cryotherapy Location  Hip    Type of Cryotherapy  Ice pack      Manual Therapy   Manual Therapy  Soft tissue mobilization;Passive ROM    Soft tissue mobilization  DTM to L TFL, VL with strumming to improve tissue quality and flexibility    Passive ROM  Manual L piriformis, TFL, glute, HS stretch with therapist x 45 sec each              PT Education - 10/20/17 1127    Education provided  Yes    Education Details  Lunge chair stretch for hip flexors     Person(s) Educated  Patient    Methods  Explanation;Demonstration;Verbal cues;Handout    Comprehension  Verbalized understanding;Returned  demonstration;Verbal cues required;Need further instruction       PT Short Term Goals - 10/18/17 1327      PT SHORT TERM GOAL #1   Title  Independent with initial HEP     Status  Achieved      PT SHORT TERM GOAL #2   Title  Pt will report overall pain reduction in L hip by 50%    Status  On-going reporting 40% reduction    reporting 40% reduction        PT Long Term Goals - 10/18/17 1337      PT LONG TERM GOAL #1   Title  Independent with ongoing HEP    Status  On-going      PT LONG TERM GOAL #2   Title  Pt will demonstrate adequate proximal LE muscle flexibility to allow for functional ROM at hip    Status  On-going      PT LONG TERM GOAL #3   Title  L hip strength >/= 4+/5 for improved stability     Status  On-going      PT LONG TERM GOAL #4   Title  Pt will report ability to stand or walk >/= 20-30 minutes w/o limitation due to L hip pain    Status  On-going Reports he needs to sit due to L hip pain following 5 min standing.     Reports he needs to sit due to L hip pain following 5 min standing.       PT LONG TERM GOAL #5   Title  Pt able to complete normal daily household chores and job tasks w/o limitation due to L hip pain or weakness    Status  On-going Pt. reporting still limited with mowing yard.     Pt. reporting still limited with mowing yard.             Plan - 10/20/17 1020    Clinical Impression Statement  Pt. doing well today reporting he felt good following last visit.  Tolerated progression of strengthening activities well today.  Heavy focus on manual LE stretching, DTM to proximal hip musculature, and foam rolling today for improved tissue quality.  HEP updated with hip flexor stretch, lunge stretch on chair.  Andrew Blevins ambulating 330 ft today before onset of L hip pain, which was relieved following sitting rest break.  Progressing well with LE strengthening however requiring correction for pacing with HEP sit<>stand activity today.  Will progress per  pt. tolerance in coming visits.       PT Treatment/Interventions  Patient/family education;ADLs/Self Care Home Management;Therapeutic exercise;Therapeutic activities;Neuromuscular re-education;Manual techniques;Dry needling;Taping;Iontophoresis 4mg /ml Dexamethasone;Electrical Stimulation;Cryotherapy;Moist Heat       Patient will benefit from skilled therapeutic intervention in order to improve the following deficits and impairments:  Pain, Impaired flexibility, Increased muscle spasms, Increased fascial restricitons, Decreased strength, Difficulty walking, Decreased activity tolerance  Visit Diagnosis: Pain in left hip  Difficulty in walking, not elsewhere classified  Muscle weakness (generalized)     Problem List There are no active problems to display for this patient.   Kermit BaloMicah Kalii Chesmore, PTA 10/20/17 11:48 AM   [facility] [address]  [address] [place], [place], [address] Phone: [phone]   Fax:  [phone]  Name: Wanda Plumperry B Currie MRN: [id number] Date of Birth: 09/19/1961

## 2017-10-25 ENCOUNTER — Ambulatory Visit: Payer: 59 | Admitting: Physical Therapy

## 2017-10-25 ENCOUNTER — Encounter: Payer: Self-pay | Admitting: Physical Therapy

## 2017-10-25 DIAGNOSIS — M25552 Pain in left hip: Secondary | ICD-10-CM

## 2017-10-25 DIAGNOSIS — R262 Difficulty in walking, not elsewhere classified: Secondary | ICD-10-CM

## 2017-10-25 DIAGNOSIS — M6281 Muscle weakness (generalized): Secondary | ICD-10-CM

## 2017-10-25 NOTE — Therapy (Signed)
[facility] [address]  [address] [place], [place], [address] Phone: [phone]   Fax:  [phone]  Physical Therapy Treatment  Patient Details  Name: Andrew Blevins MRN: [id number] Date of Birth: 09/21/1961 Referring Provider: Hulan SaasBenjamin J. Ditty, MD   Encounter Date: 10/25/2017  PT End of Session - 10/25/17 1402    Visit Number  12    Number of Visits  16    Date for PT Re-Evaluation  11/10/17    Authorization Type  UHC    Authorization - Number of Visits  55    PT Start Time  1402    PT Stop Time  1505    PT Time Calculation (min)  63 min    Activity Tolerance  Patient tolerated treatment well    Behavior During Therapy  [facility] for tasks assessed/performed       Past Medical History:  Diagnosis Date  . ED (erectile dysfunction)   . GERD (gastroesophageal reflux disease)   . History of Helicobacter pylori infection 02/2005  . Hypertension     Past Surgical History:  Procedure Laterality Date  . INGUINAL HERNIA REPAIR Left 1997  . SHOULDER SURGERY      There were no vitals filed for this visit.  Subjective Assessment - 10/25/17 1405    Subjective  Pt reporting no pain at rest but up to 6/10 with movement "standing, and moving around". Pt reporting he received a deep tissue massage to the leg last week which he thinks helped.    Pertinent History  08/10/17 - L2 & L3 laminectomy (per pt report)    How long can you stand comfortably?  15    Patient Stated Goals  "get the strength back in the leg and get rid of the pain so I can stand or walk longer"    Currently in Pain?  Yes    Pain Score  0-No pain up to 6/10    Pain Location  Trochanter    Pain Orientation  Left;Lateral    Pain Descriptors / Indicators  Throbbing    Pain Type  Acute pain                      [facility] Adult PT Treatment/Exercise - 10/25/17 1402      Lumbar Exercises: Quadruped   Straight Leg Raise  10 reps;3 seconds    Straight  Leg Raises Limitations  torso supported on peanut ball    Other Quadruped Lumbar Exercises  B fire hydrants (torso supported on peanut ball) 10x3"      Knee/Hip Exercises: Stretches   Hip Flexor Stretch  Left;30 seconds;2 reps    Hip Flexor Stretch Limitations  lunge postion in 1/2 kneel on floor    Other Knee/Hip Stretches  Foam rolling to L piriformis/glutes, ITB & quads (emphasis on VL)      Knee/Hip Exercises: Aerobic   Recumbent Bike  lvl 4 x 6'      Knee/Hip Exercises: Supine   Bridges with Clamshell  Both;15 reps;Strengthening + alt hip ABD/ER with green TB      Modalities   Modalities  Electrical Stimulation;Cryotherapy      Cryotherapy   Number Minutes Cryotherapy  15 Minutes    Cryotherapy Location  Hip    Type of Cryotherapy  Ice pack      Electrical Stimulation   Electrical Stimulation Location  L hip complex    Electrical Stimulation Action  IFC  Electrical Stimulation Parameters  80-150 Hz, intensity to pt tol x15'    Electrical Stimulation Goals  Pain;Tone      Manual Therapy   Manual Therapy  Soft tissue mobilization;Myofascial release    Soft tissue mobilization  STM/DTM to L piriformis/glutes    Myofascial Release  TPR to L piriformis               PT Short Term Goals - 10/18/17 1327      PT SHORT TERM GOAL #1   Title  Independent with initial HEP     Status  Achieved      PT SHORT TERM GOAL #2   Title  Pt will report overall pain reduction in L hip by 50%    Status  On-going reporting 40% reduction         PT Long Term Goals - 10/18/17 1337      PT LONG TERM GOAL #1   Title  Independent with ongoing HEP    Status  On-going      PT LONG TERM GOAL #2   Title  Pt will demonstrate adequate proximal LE muscle flexibility to allow for functional ROM at hip    Status  On-going      PT LONG TERM GOAL #3   Title  L hip strength >/= 4+/5 for improved stability     Status  On-going      PT LONG TERM GOAL #4   Title  Pt will report  ability to stand or walk >/= 20-30 minutes w/o limitation due to L hip pain    Status  On-going Reports he needs to sit due to L hip pain following 5 min standing.        PT LONG TERM GOAL #5   Title  Pt able to complete normal daily household chores and job tasks w/o limitation due to L hip pain or weakness    Status  On-going Pt. reporting still limited with mowing yard.              Plan - 10/25/17 1408    Clinical Impression Statement  Pt reporting no pain at rest currently with pain typically only present with movement/walking. Continues to demonstrate increased muscle tension/taut bands/TPs in piriformis, glutes and ITB, therefore reviewed STM and stretching to address this as part of HEP. Strengthening focusing on lumbopelvic control with emphasis on glutes and piriformis. Pt wishing to defer final ionto patch at this time, therefore treatment concluded with estim and ice pack to promote further muscle relaxation.    Rehab Potential  Good    PT Treatment/Interventions  Patient/family education;ADLs/Self Care Home Management;Therapeutic exercise;Therapeutic activities;Neuromuscular re-education;Manual techniques;Dry needling;Taping;Iontophoresis 4mg /ml Dexamethasone;Electrical Stimulation;Cryotherapy;Moist Heat    Consulted and Agree with Plan of Care  Patient       Patient will benefit from skilled therapeutic intervention in order to improve the following deficits and impairments:  Pain, Impaired flexibility, Increased muscle spasms, Increased fascial restricitons, Decreased strength, Difficulty walking, Decreased activity tolerance  Visit Diagnosis: Pain in left hip  Difficulty in walking, not elsewhere classified  Muscle weakness (generalized)     Problem List There are no active problems to display for this patient.   Marry GuanJoAnne M Sai Moura, PT, MPT 10/25/2017, 5:39 PM  [facility] [address]  [address] [place], [place], [address] Phone: [phone]   Fax:  [phone]  Name: Andrew Blevins MRN: [id number] Date of Birth: 01/17/1961

## 2017-10-27 ENCOUNTER — Ambulatory Visit: Payer: 59

## 2017-10-27 DIAGNOSIS — M7062 Trochanteric bursitis, left hip: Secondary | ICD-10-CM | POA: Diagnosis not present

## 2017-10-27 DIAGNOSIS — M25552 Pain in left hip: Secondary | ICD-10-CM

## 2017-10-27 DIAGNOSIS — M6281 Muscle weakness (generalized): Secondary | ICD-10-CM

## 2017-10-27 DIAGNOSIS — R262 Difficulty in walking, not elsewhere classified: Secondary | ICD-10-CM

## 2017-10-27 NOTE — Therapy (Signed)
[facility] [address]  [address] [place], [place], [address] Phone: [phone]   Fax:  [phone]  Physical Therapy Treatment  Patient Details  Name: Andrew Blevins MRN: [id number] Date of Birth: 1960-12-25 Referring Provider: Hulan Saas, MD   Encounter Date: 10/27/2017  PT End of Session - 10/27/17 1025    Visit Number  13    Number of Visits  16    Date for PT Re-Evaluation  11/10/17    Authorization Type  UHC    Authorization - Number of Visits  55    PT Start Time  1019 pt. arrived late     PT Stop Time  1120    PT Time Calculation (min)  61 min    Activity Tolerance  Patient tolerated treatment well    Behavior During Therapy  [facility] for tasks assessed/performed       Past Medical History:  Diagnosis Date  . ED (erectile dysfunction)   . GERD (gastroesophageal reflux disease)   . History of Helicobacter pylori infection 02/2005  . Hypertension     Past Surgical History:  Procedure Laterality Date  . INGUINAL HERNIA REPAIR Left 1997  . SHOULDER SURGERY      There were no vitals filed for this visit.  Subjective Assessment - 10/27/17 1023    Subjective  Pt. reporting some improvement in walking and standing tolerance today.      Patient Stated Goals  "get the strength back in the leg and get rid of the pain so I can stand or walk longer"    Currently in Pain?  Yes    Pain Location  Buttocks    Pain Orientation  Left;Lateral    Pain Descriptors / Indicators  -- "stiffness"    Pain Type  Acute pain    Pain Onset  More than a month ago    Pain Frequency  Intermittent    Aggravating Factors   standing, walking     Pain Relieving Factors  sitting, rest, rest    Effect of Pain on Daily Activities  limits walking tolerance    Multiple Pain Sites  No                      [facility] Adult PT Treatment/Exercise - 10/27/17 1047      Lumbar Exercises: Quadruped   Straight Leg Raise  10 reps;3  seconds    Other Quadruped Lumbar Exercises  B fire hydrants (torso supported on peanut ball) 10x3"      Knee/Hip Exercises: Aerobic   Recumbent Bike  lvl 4 x 6'      Knee/Hip Exercises: Standing   Hip Extension  Left;Right;Stengthening;10 reps;Knee straight    Extension Limitations  Fitter hip extension (1 black band) with 3" hold glute squeeze x 10 reps; with pt. forward trunk flexion over chair     Forward Step Up  Step Height: 6";Left;10 reps    Forward Step Up Limitations  with eccentric step-down; 1 UE support on counter      Other Standing Knee Exercises  L 2# (bent knee) hip extension leaning over counter x 10 reps; tactile cueing for glute squeeze     Other Standing Knee Exercises  Backward monster walk at counter with red TB at ankles x 2 laps down/back       Knee/Hip Exercises: Sidelying   Clams  R sidelying L clam shell with red TB at knees x 15  reps  Reporting he has not be performing this activity freq. w/HEP      Cryotherapy   Number Minutes Cryotherapy  15 Minutes    Cryotherapy Location  Hip    Type of Cryotherapy  Ice pack      Electrical Stimulation   Electrical Stimulation Location  L hip complex    Electrical Stimulation Action  IFC    Electrical Stimulation Parameters  80-150Hz , intensity to pt. tolerance, 15'    Electrical Stimulation Goals  Pain;Tone      Manual Therapy   Manual Therapy  Soft tissue mobilization;Myofascial release;Taping    Soft tissue mobilization  STM/DTM to L piriformis/glutes, STM/strumming to L ITB along area of reported tenderness     Myofascial Release  TPR to L piriformis    Passive ROM  Manual L piriformis, glute, HS stretch with therapist x 30 sec each       Kinesiotix   Create Space  L ITB - 30% along length of ITB with two 50% cross strips at mid ITB over area of most tenderness              PT Education - 10/27/17 1128    Education provided  Yes    Education Details  self-ball release with tennis ball on wall for  glutes     Person(s) Educated  Patient    Methods  Explanation;Verbal cues;Handout    Comprehension  Verbalized understanding;Returned demonstration;Verbal cues required;Need further instruction       PT Short Term Goals - 10/18/17 1327      PT SHORT TERM GOAL #1   Title  Independent with initial HEP     Status  Achieved      PT SHORT TERM GOAL #2   Title  Pt will report overall pain reduction in L hip by 50%    Status  On-going reporting 40% reduction         PT Long Term Goals - 10/18/17 1337      PT LONG TERM GOAL #1   Title  Independent with ongoing HEP    Status  On-going      PT LONG TERM GOAL #2   Title  Pt will demonstrate adequate proximal LE muscle flexibility to allow for functional ROM at hip    Status  On-going      PT LONG TERM GOAL #3   Title  L hip strength >/= 4+/5 for improved stability     Status  On-going      PT LONG TERM GOAL #4   Title  Pt will report ability to stand or walk >/= 20-30 minutes w/o limitation due to L hip pain    Status  On-going Reports he needs to sit due to L hip pain following 5 min standing.        PT LONG TERM GOAL #5   Title  Pt able to complete normal daily household chores and job tasks w/o limitation due to L hip pain or weakness    Status  On-going Pt. reporting still limited with mowing yard.              Plan - 10/27/17 1026    Clinical Impression Statement  Pt. seen to start treatment reporting good relief from "buttocks tightness" which he attributes to manual massage last visit.  Reports he purchased foam roller and has been rolling glutes last few days.  Pt. reporting use of self-ball release to glutes with tennis ball on wall  with good relief.  HEP updated with handout for ball release for glutes, as pt. has not yet received picture of this.  Manual DTM performed to L lateral glutes as pt. still palpably tight her with TP's with pt. reporting good relief.  Some report of "tightness" in L lateral thigh  musculature/ITB today thus trial of taping applied along length of ITB with cross-strips over area of most tenderness.  Pt. tolerated progression of strengthening activities focused on hip/glute strengthening for hopeful improvement in lumbopelvic control with walking and standing.  Pt. reporting he has had some improved tolerance for walking and standing today.  Will monitor response to taping and therex advancement in coming visits.  Ended with E-stim./cold pack as pt. continues to note good pain relief from this.      PT Treatment/Interventions  Patient/family education;ADLs/Self Care Home Management;Therapeutic exercise;Therapeutic activities;Neuromuscular re-education;Manual techniques;Dry needling;Taping;Iontophoresis 4mg /ml Dexamethasone;Electrical Stimulation;Cryotherapy;Moist Heat       Patient will benefit from skilled therapeutic intervention in order to improve the following deficits and impairments:  Pain, Impaired flexibility, Increased muscle spasms, Increased fascial restricitons, Decreased strength, Difficulty walking, Decreased activity tolerance  Visit Diagnosis: Pain in left hip  Difficulty in walking, not elsewhere classified  Muscle weakness (generalized)     Problem List There are no active problems to display for this patient.   Kermit BaloMicah Alyssa Rotondo, PTA 10/27/17 11:49 AM  [facility] [address]  [address] [place], [place], [address] Phone: [phone]   Fax:  [phone]  Name: Wanda Plumperry B Steckman MRN: [id number] Date of Birth: 04/15/1961

## 2017-11-02 ENCOUNTER — Ambulatory Visit: Payer: 59

## 2017-11-02 DIAGNOSIS — M25552 Pain in left hip: Secondary | ICD-10-CM

## 2017-11-02 DIAGNOSIS — M6281 Muscle weakness (generalized): Secondary | ICD-10-CM

## 2017-11-02 DIAGNOSIS — R262 Difficulty in walking, not elsewhere classified: Secondary | ICD-10-CM

## 2017-11-02 NOTE — Therapy (Signed)
[facility] [address]  [address] [facility] [place] [place], [place], [address] Phone: [phone]   Fax:  [phone]  Physical Therapy Treatment  Patient Details  Name: Andrew Blevins MRN: [id number] Date of Birth: 08/06/1961 Referring Provider: Hulan SaasBenjamin J. Ditty, MD   Encounter Date: 11/02/2017  PT End of Session - 11/02/17 1133    Visit Number  14    Number of Visits  16    Date for PT Re-Evaluation  11/10/17    Authorization Type  UHC    Authorization - Number of Visits  55    PT Start Time  1100    PT Stop Time  1155 10 min ice pack to end treatment    PT Time Calculation (min)  55 min    Activity Tolerance  Patient tolerated treatment well    Behavior During Therapy  [facility] for tasks assessed/performed       Past Medical History:  Diagnosis Date  . ED (erectile dysfunction)   . GERD (gastroesophageal reflux disease)   . History of Helicobacter pylori infection 02/2005  . Hypertension     Past Surgical History:  Procedure Laterality Date  . INGUINAL HERNIA REPAIR Left 1997  . SHOULDER SURGERY      There were no vitals filed for this visit.  Subjective Assessment - 11/02/17 1107    Subjective  Pt. reporting he was able to walk ~ 700 ft from car to stadium on Saturday however had to take a sitting rest break due to "throbbing pain" in buttocks.      Pertinent History  08/10/17 - L2 & L3 laminectomy (per pt report)    Patient Stated Goals  "get the strength back in the leg and get rid of the pain so I can stand or walk longer"    Currently in Pain?  Yes    Pain Score  5     Pain Location  Buttocks    Pain Orientation  Medial;Left    Pain Type  Acute pain    Pain Onset  More than a month ago    Pain Frequency  Intermittent    Aggravating Factors   standing, walking     Pain Relieving Factors  sitting, rest    Effect of Pain on Daily Activities  limits walking and standing tolerance    Multiple Pain Sites  No                       [facility] Adult PT Treatment/Exercise - 11/02/17 1145      Knee/Hip Exercises: Stretches   Other Knee/Hip Stretches  Foam rolling to L glute/piriformis area x 1 min  pt. requiring heavy cueing for proper technique      Knee/Hip Exercises: Aerobic   Recumbent Bike  lvl 4 x 6'      Knee/Hip Exercises: Standing   Hip Extension  Right;Left;15 reps;Knee straight 3" hold     Extension Limitations  leaning over peanut p-ball on table     Step Down  Right;Left;Step Height: 6";15 reps    Step Down Limitations  light UE support on counter     Wall Squat  10 reps;3 seconds    Wall Squat Limitations  leaning on green P-ball       Cryotherapy   Number Minutes Cryotherapy  10 Minutes    Cryotherapy Location  Hip    Type of Cryotherapy  Ice pack      Manual  Therapy   Manual Therapy  Soft tissue mobilization;Myofascial release;Taping    Manual therapy comments  R sidelying with L LE elevated on bolster, prone     Soft tissue mobilization  DTM to L piriformis/glutes     Myofascial Release  TPR to L piriformis    Passive ROM  Manual mod piriformis, glute, TFL/ITB stretch (in sidelying) stretch with therapist x 30 sec each     Kinesiotex  Create Space      Kinesiotix   Create Space  Taping to L piriformis I-strip (30% stretch) from origin to insertion, 2 cross-strips (30% stretch) in star pattern over area of most tenderness               PT Short Term Goals - 10/18/17 1327      PT SHORT TERM GOAL #1   Title  Independent with initial HEP     Status  Achieved      PT SHORT TERM GOAL #2   Title  Pt will report overall pain reduction in L hip by 50%    Status  On-going reporting 40% reduction         PT Long Term Goals - 10/18/17 1337      PT LONG TERM GOAL #1   Title  Independent with ongoing HEP    Status  On-going      PT LONG TERM GOAL #2   Title  Pt will demonstrate adequate proximal LE muscle flexibility to allow for functional ROM at hip     Status  On-going      PT LONG TERM GOAL #3   Title  L hip strength >/= 4+/5 for improved stability     Status  On-going      PT LONG TERM GOAL #4   Title  Pt will report ability to stand or walk >/= 20-30 minutes w/o limitation due to L hip pain    Status  On-going Reports he needs to sit due to L hip pain following 5 min standing.        PT LONG TERM GOAL #5   Title  Pt able to complete normal daily household chores and job tasks w/o limitation due to L hip pain or weakness    Status  On-going Pt. reporting still limited with mowing yard.              Plan - 11/02/17 1134    Clinical Impression Statement  Manson seen today with complaint of buttocks pain following periods of prolonged walking and standing.  Palpable TP in L gluteal/piriformis that persists despite previously performed manual TPR and rolling/self-ball release performed with pt.  Pt. may be a good candidate for DN in this area as "buttocks pain" seems to be primary complaint with walking/standing.  Aurel tolerated all glute and LE strengthening activities well in treatment today.  Trial of taping applied to L glute/piriformis for hopeful decrease in tone and pain with functional activities.  Will monitor response at upcoming visit.      PT Treatment/Interventions  Patient/family education;ADLs/Self Care Home Management;Therapeutic exercise;Therapeutic activities;Neuromuscular re-education;Manual techniques;Dry needling;Taping;Iontophoresis 4mg /ml Dexamethasone;Electrical Stimulation;Cryotherapy;Moist Heat       Patient will benefit from skilled therapeutic intervention in order to improve the following deficits and impairments:  Pain, Impaired flexibility, Increased muscle spasms, Increased fascial restricitons, Decreased strength, Difficulty walking, Decreased activity tolerance  Visit Diagnosis: Pain in left hip  Difficulty in walking, not elsewhere classified  Muscle weakness (generalized)     Problem  List There are no active problems to display for this patient.   Kermit BaloMicah Daphene Chisholm, PTA 11/02/17 8:21 PM  [facility] [address]  [address] [place], [place], [address] Phone: [phone]   Fax:  [phone]  Name: Andrew Blevins MRN: [id number] Date of Birth: 06/25/1961

## 2017-11-08 ENCOUNTER — Ambulatory Visit: Payer: 59 | Admitting: Physical Therapy

## 2017-11-08 ENCOUNTER — Encounter: Payer: Self-pay | Admitting: Physical Therapy

## 2017-11-08 DIAGNOSIS — M25552 Pain in left hip: Secondary | ICD-10-CM

## 2017-11-08 DIAGNOSIS — R262 Difficulty in walking, not elsewhere classified: Secondary | ICD-10-CM

## 2017-11-08 DIAGNOSIS — M6281 Muscle weakness (generalized): Secondary | ICD-10-CM

## 2017-11-08 NOTE — Patient Instructions (Signed)

## 2017-11-08 NOTE — Therapy (Signed)
[facility] [address]  [address] [place], [place], [address] Phone: [phone]   Fax:  [phone]  Physical Therapy Treatment  Patient Details  Name: Andrew Blevins MRN: [id number] Date of Birth: 12/27/1960 Referring Provider: Hulan SaasBenjamin J. Ditty, MD   Encounter Date: 11/08/2017  PT End of Session - 11/08/17 1449    Visit Number  15    Number of Visits  16    Date for PT Re-Evaluation  11/10/17    Authorization Type  UHC    Authorization - Number of Visits  55    PT Start Time  1449    PT Stop Time  1542    PT Time Calculation (min)  53 min    Activity Tolerance  Patient tolerated treatment well    Behavior During Therapy  [facility] for tasks assessed/performed       Past Medical History:  Diagnosis Date  . ED (erectile dysfunction)   . GERD (gastroesophageal reflux disease)   . History of Helicobacter pylori infection 02/2005  . Hypertension     Past Surgical History:  Procedure Laterality Date  . INGUINAL HERNIA REPAIR Left 1997  . SHOULDER SURGERY      There were no vitals filed for this visit.  Subjective Assessment - 11/08/17 1450    Subjective  Pt reports noting more tightness in L thigh today vs hip. States he has been stretching and using the roller and ball to help with the tightness.    Pertinent History  08/10/17 - L2 & L3 laminectomy (per pt report)    Patient Stated Goals  "get the strength back in the leg and get rid of the pain so I can stand or walk longer"    Currently in Pain?  Yes    Pain Score  5     Pain Location  Buttocks    Pain Orientation  Left;Medial    Pain Descriptors / Indicators  Tightness    Pain Type  Acute pain    Pain Score  7    Pain Location  Leg    Pain Orientation  Left;Upper;Anterior    Pain Descriptors / Indicators  Tightness                      [facility] Adult PT Treatment/Exercise - 11/08/17 1449      Knee/Hip Exercises: Aerobic   Recumbent Bike  lvl 4  x 6'      Modalities   Modalities  Electrical Stimulation;Cryotherapy      Cryotherapy   Number Minutes Cryotherapy  10 Minutes    Cryotherapy Location  Hip    Type of Cryotherapy  Ice pack      Electrical Stimulation   Electrical Stimulation Location  L hip complex & lat quads    Electrical Stimulation Action  Pre-mod    Electrical Stimulation Parameters  intensity to pt tol x10'    Electrical Stimulation Goals  Pain;Tone      Manual Therapy   Manual Therapy  Soft tissue mobilization;Myofascial release    Soft tissue mobilization  STM/DTM to L piriformis/glutes & distal mid/lat quads    Myofascial Release  TPR to L piriformis, RF/VI & VL       Trigger Point Dry Needling - 11/08/17 1449    Consent Given?  Yes    Education Handout Provided  Yes    Muscles Treated Lower Body  Quadriceps;Piriformis;Gluteus minimus;Gluteus maximus    Gluteus  Maximus Response  Twitch response elicited;Palpable increased muscle length    Gluteus Minimus Response  Twitch response elicited;Palpable increased muscle length    Piriformis Response  Twitch response elicited;Palpable increased muscle length    Quadriceps Response  Twitch response elicited;Palpable increased muscle length             PT Short Term Goals - 10/18/17 1327      PT SHORT TERM GOAL #1   Title  Independent with initial HEP     Status  Achieved      PT SHORT TERM GOAL #2   Title  Pt will report overall pain reduction in L hip by 50%    Status  On-going reporting 40% reduction         PT Long Term Goals - 10/18/17 1337      PT LONG TERM GOAL #1   Title  Independent with ongoing HEP    Status  On-going      PT LONG TERM GOAL #2   Title  Pt will demonstrate adequate proximal LE muscle flexibility to allow for functional ROM at hip    Status  On-going      PT LONG TERM GOAL #3   Title  L hip strength >/= 4+/5 for improved stability     Status  On-going      PT LONG TERM GOAL #4   Title  Pt will report  ability to stand or walk >/= 20-30 minutes w/o limitation due to L hip pain    Status  On-going Reports he needs to sit due to L hip pain following 5 min standing.        PT LONG TERM GOAL #5   Title  Pt able to complete normal daily household chores and job tasks w/o limitation due to L hip pain or weakness    Status  On-going Pt. reporting still limited with mowing yard.              Plan - 11/08/17 1453    Clinical Impression Statement  Pt returning today with reports of lessening L buttock pain (pain rating unchanged) but new pain in L distal lateral quads. Pt continues to demonstrate pronounced increased muscle tension, taut bands and TPs despite reported good compliance with HEP stretches and self STM/foam rolling, therefore discussed trial of DN to address continued abnormal muscle tension with pt providing consent for DN treatment after education provided. Positive twitch repsonse elicited with decreased muscle tension noted following treatment. Encouraged pt to continue to work on stretches and self-STM/foam rolling at home and will assess further response as on next visit. If positive response noted, may consider recert to allow for additional DN as indicated.    Rehab Potential  Good    PT Treatment/Interventions  Patient/family education;ADLs/Self Care Home Management;Therapeutic exercise;Therapeutic activities;Neuromuscular re-education;Manual techniques;Dry needling;Taping;Iontophoresis 4mg /ml Dexamethasone;Electrical Stimulation;Cryotherapy;Moist Heat    PT Next Visit Plan  Recert vs discharge    Consulted and Agree with Plan of Care  Patient       Patient will benefit from skilled therapeutic intervention in order to improve the following deficits and impairments:  Pain, Impaired flexibility, Increased muscle spasms, Increased fascial restricitons, Decreased strength, Difficulty walking, Decreased activity tolerance  Visit Diagnosis: Pain in left hip  Difficulty in  walking, not elsewhere classified  Muscle weakness (generalized)     Problem List There are no active problems to display for this patient.   Marry Guan, PT, MPT 11/08/2017, 8:15 PM  [facility] [address]  [address] [place], [place], [address] Phone: [phone]   Fax:  [phone]  Name: Andrew Blevins MRN: [id number] Date of Birth: 12/03/1961

## 2017-11-10 ENCOUNTER — Encounter: Payer: Self-pay | Admitting: Physical Therapy

## 2017-11-10 ENCOUNTER — Ambulatory Visit: Payer: 59 | Admitting: Physical Therapy

## 2017-11-10 DIAGNOSIS — M25552 Pain in left hip: Secondary | ICD-10-CM | POA: Diagnosis not present

## 2017-11-10 DIAGNOSIS — R262 Difficulty in walking, not elsewhere classified: Secondary | ICD-10-CM

## 2017-11-10 DIAGNOSIS — M6281 Muscle weakness (generalized): Secondary | ICD-10-CM

## 2017-11-10 NOTE — Therapy (Signed)
[facility] [address]  [place], [place], [address] Phone: [phone]   Fax:  [phone]  Physical Therapy Treatment  Patient Details  Name: Andrew Blevins MRN: [id number] Date of Birth: 1961/01/09 Referring Provider: Aldean Ast, MD   Encounter Date: 11/10/2017  PT End of Session - 11/10/17 1015    Visit Number  16    Number of Visits  24    Date for PT Re-Evaluation  12/10/17    Authorization Type  UHC    Authorization - Number of Visits  55    PT Start Time  0355    PT Stop Time  1112    PT Time Calculation (min)  57 min    Activity Tolerance  Patient tolerated treatment well    Behavior During Therapy  [facility] for tasks assessed/performed       Past Medical History:  Diagnosis Date  . ED (erectile dysfunction)   . GERD (gastroesophageal reflux disease)   . History of Helicobacter pylori infection 02/2005  . Hypertension     Past Surgical History:  Procedure Laterality Date  . INGUINAL HERNIA REPAIR Left 1997  . SHOULDER SURGERY      There were no vitals filed for this visit.  Subjective Assessment - 11/10/17 1016    Subjective  Pt noting some relief from DN last session. Pt reporting overall 50% reduction in pain since start of PT.    Pertinent History  08/10/17 - L2 & L3 laminectomy (per pt report)    Limitations  Standing;Walking;House hold activities    How long can you stand comfortably?  5-10 minutes    How long can you walk comfortably?  60 yards     Patient Stated Goals  "get the strength back in the leg and get rid of the pain so I can stand or walk longer"    Currently in Pain?  Yes    Pain Score  5     Pain Location  Buttocks    Pain Orientation  Left;Lower    Pain Descriptors / Indicators  Throbbing    Pain Type  Acute pain    Pain Onset  More than a month ago    Pain Frequency  Intermittent    Aggravating Factors   prolonged standing & walking    Pain Relieving Factors  ice,  stretching, sitting, rest    Effect of Pain on Daily Activities  limits walking longer distances    Pain Score  4    Pain Location  Leg    Pain Orientation  Left;Upper;Anterior    Pain Descriptors / Indicators  Tightness    Pain Type  Acute pain    Pain Onset  1 to 4 weeks ago    Pain Frequency  Intermittent    Aggravating Factors   walking    Pain Relieving Factors  rolling    Effect of Pain on Daily Activities  more limited by buttock pain          [facility] PT Assessment - 11/10/17 0930      Assessment   Medical Diagnosis  L hip bursitis s/p lumbar surgery     Referring Provider  Aldean Ast, MD    Onset Date/Surgical Date  08/10/17    Next MD Visit  11/11/17      Strength   Right Hip Flexion  4+/5    Right Hip Extension  4-/5 limited motion    Right  Hip External Rotation   5/5    Right Hip Internal Rotation  4+/5    Right Hip ABduction  4+/5    Right Hip ADduction  4/5    Left Hip Flexion  4/5    Left Hip Extension  3+/5 limited motion    Left Hip External Rotation  4/5    Left Hip Internal Rotation  4/5    Left Hip ABduction  4/5    Left Hip ADduction  4/5    Right Knee Flexion  5/5    Right Knee Extension  5/5    Left Knee Flexion  4+/5    Left Knee Extension  5/5                  [facility] Adult PT Treatment/Exercise - 11/10/17 1015      Exercises   Exercises  Knee/Hip      Knee/Hip Exercises: Stretches   Passive Hamstring Stretch  Left;30 seconds;1 rep    Passive Hamstring Stretch Limitations  supine with strap    Hip Flexor Stretch  Left;30 seconds;2 reps;1 rep each    Hip Flexor Stretch Limitations  mod thomas with strap, lunge position in 1/2 kneel    ITB Stretch  Left;30 seconds;1 rep    ITB Stretch Limitations  supine with strap    Piriformis Stretch  Left;30 seconds;1 rep each    Piriformis Stretch Limitations  seated figure 4 with overpressure & fwd lean, KTOS; supine figure 4 with strap      Knee/Hip Exercises: Aerobic   Recumbent  Bike  lvl 4 x 6'      Knee/Hip Exercises: Standing   Hip Extension  Left;15 reps;Knee straight;Stengthening    Extension Limitations  at 45 dg angle to abduction with looped green TB      Knee/Hip Exercises: Supine   Bridges with Clamshell  Both;15 reps;Strengthening + alt hip ABD/ER with green TB      Knee/Hip Exercises: Sidelying   Clams  L clam with green TB in R sidelying 15x3"      Modalities   Modalities  Cryotherapy      Cryotherapy   Number Minutes Cryotherapy  10 Minutes    Cryotherapy Location  Hip    Type of Cryotherapy  Ice pack               PT Short Term Goals - 11/10/17 1022      PT SHORT TERM GOAL #1   Title  Independent with initial HEP     Status  Achieved      PT SHORT TERM GOAL #2   Title  Pt will report overall pain reduction in L hip by 50%    Status  Achieved        PT Long Term Goals - 11/10/17 1023      PT LONG TERM GOAL #1   Title  Independent with ongoing HEP    Status  Partially Met met for current HEP    Target Date  12/10/17      PT LONG TERM GOAL #2   Title  Pt will demonstrate adequate proximal LE muscle flexibility to allow for functional ROM at hip    Status  On-going    Target Date  12/10/17      PT LONG TERM GOAL #3   Title  L hip strength >/= 4+/5 for improved stability     Status  On-going    Target Date  12/10/17  PT LONG TERM GOAL #4   Title  Pt will report ability to stand or walk >/= 20-30 minutes w/o limitation due to L hip pain    Status  On-going    Target Date  12/10/17      PT LONG TERM GOAL #5   Title  Pt able to complete normal daily household chores and job tasks w/o limitation due to L hip pain or weakness    Status  On-going Continued poor tolerance for yardwork; remains out of work    Target Date  12/10/17            Plan - 11/10/17 1035    Clinical Impression Statement  Andrew Blevins reporting 50% reduction in pain with therapy thus far, although daily pain ratings do not fully reflect this  with current/recent pain ratings at 5/10 vs 7/10 on eval. Lateral hip pain completely resolved with current hip pain more localized to glutes/piriformis and quads. Flexibility improving but continued increased muscle tension and tighness still limits full ROM at hip, although positive response noted with initial trial of DN on last visit. LE strength overall improved but L hip remains on average 1 grade weaker than R per MMT. 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 Given progress with PT, especially promising response to DN, along with continued deficits limiting activity tolerance, recommend recert for an additional 2x/wk x 4 wks.    Rehab Potential  Good    PT Frequency  2x / week    PT Duration  4 weeks    PT Treatment/Interventions  Patient/family education;ADLs/Self Care Home Management;Therapeutic exercise;Therapeutic activities;Neuromuscular re-education;Manual techniques;Dry needling;Taping;Iontophoresis 33m/ml Dexamethasone;Electrical Stimulation;Cryotherapy;Moist Heat;Ultrasound;Passive range of motion    Consulted and Agree with Plan of Care  Patient       Patient will benefit from skilled therapeutic intervention in order to improve the following deficits and impairments:  Pain, Impaired flexibility, Increased muscle spasms, Increased fascial restricitons, Decreased strength, Difficulty walking, Decreased activity tolerance  Visit Diagnosis: Pain in left hip  Difficulty in walking, not elsewhere classified  Muscle weakness (generalized)     Problem List There are no active problems to display for this patient.   JPercival Spanish PT, MPT 11/10/2017, 1:45 PM  [facility] [place] [address]Phone: [phone]  Fax:  [phone] Name: Andrew WATCHMANMRN: [id number] of Birth: [birth year]

## 2017-11-15 ENCOUNTER — Ambulatory Visit: Payer: 59 | Attending: Neurological Surgery

## 2017-11-15 DIAGNOSIS — M25552 Pain in left hip: Secondary | ICD-10-CM | POA: Diagnosis not present

## 2017-11-15 DIAGNOSIS — M6281 Muscle weakness (generalized): Secondary | ICD-10-CM

## 2017-11-15 DIAGNOSIS — R262 Difficulty in walking, not elsewhere classified: Secondary | ICD-10-CM

## 2017-11-15 NOTE — Therapy (Signed)
[facility] [address]  [place], [place], [address] Phone: [phone]   Fax:  [phone]  Physical Therapy Treatment  Patient Details  Name: Andrew Blevins MRN: [id number] Date of Birth: [birth year] Referring Provider: Aldean Ast, MD   Encounter Date: 11/15/2017  PT End of Session - 11/15/17 1326    Visit Number  17    Number of Visits  24    Date for PT Re-Evaluation  12/10/17    Authorization Type  UHC    Authorization - Number of Visits  36    PT Start Time  9528    PT Stop Time  1410 ended tx with 10 min cold pack    PT Time Calculation (min)  53 min    Activity Tolerance  Patient tolerated treatment well    Behavior During Therapy  [facility] for tasks assessed/performed       Past Medical History:  Diagnosis Date  . ED (erectile dysfunction)   . GERD (gastroesophageal reflux disease)   . History of Helicobacter pylori infection 02/2005  . Hypertension     Past Surgical History:  Procedure Laterality Date  . INGUINAL HERNIA REPAIR Left 1997  . SHOULDER SURGERY      There were no vitals filed for this visit.  Subjective Assessment - 11/15/17 1319    Subjective  Pt. reporting back cortisone injection scheduled for 12.11.18 following visit with MD.      Pertinent History  08/10/17 - L2 & L3 laminectomy (per pt report)    How long can you sit comfortably?  "It doesn't bother me sitting now"    How long can you stand comfortably?  5-10 minutes    How long can you walk comfortably?  60 yards     Patient Stated Goals  "get the strength back in the leg and get rid of the pain so I can stand or walk longer"    Currently in Pain?  Yes    Pain Score  6     Pain Location  Buttocks    Pain Orientation  Left;Lower    Pain Descriptors / Indicators  Throbbing    Pain Type  Acute pain    Pain Onset  More than a month ago    Aggravating Factors   Prolonged standing, walking     Multiple Pain Sites  No    Pain Score  2    Pain Location  Leg    Pain Orientation  Left;Upper    Pain Descriptors / Indicators  Tightness    Pain Type  Acute pain    Pain Onset  1 to 4 weeks ago    Pain Frequency  Intermittent    Aggravating Factors   walking     Pain Relieving Factors  rolling          [facility] PT Assessment - 11/15/17 1333      Assessment   Next MD Visit  ~ 12.29.18                  [facility] Adult PT Treatment/Exercise - 11/15/17 1331      Lumbar Exercises: Machines for Strengthening   Cybex Knee Flexion  B con/L ecc 25# x 15 reps       Knee/Hip Exercises: Stretches   Hip Flexor Stretch  Left;30 seconds;2 reps;1 rep    Hip Flexor Stretch Limitations  mod thomas with strap    ITB Stretch  Left;30  seconds;1 rep    ITB Stretch Limitations  supine with strap    Piriformis Stretch  Left;30 seconds;1 rep    Piriformis Stretch Limitations  KTOS      Knee/Hip Exercises: Aerobic   Recumbent Bike  lvl 4 x 6'      Knee/Hip Exercises: Standing   Hip Flexion  Right;Left;10 reps;Knee straight    Hip Flexion Limitations  green looped TB at ankles; counter suppor t    Forward Lunges  Right;Left;10 reps;3 seconds    Forward Lunges Limitations  mini lunge at counter     Hip Abduction  Right;Left;10 reps;Knee straight    Abduction Limitations  Green looped TB at ankles; UE support at counter     Hip Extension  10 reps;Right;Left;Stengthening;Knee straight    Extension Limitations  at 45 dg angle to abduction with looped green TB    Forward Step Up  Right;Left;Step Height: 6";15 reps    Forward Step Up Limitations  with eccentric step-down; 1 UE support on counter      Step Down  Right;Left;Step Height: 6";15 reps    Step Down Limitations  light UE support at machine       Cryotherapy   Number Minutes Cryotherapy  10 Minutes    Cryotherapy Location  Hip    Type of Cryotherapy  Ice pack               PT Short Term Goals - 11/10/17 1022      PT SHORT TERM GOAL #1   Title   Independent with initial HEP     Status  Achieved      PT SHORT TERM GOAL #2   Title  Pt will report overall pain reduction in L hip by 50%    Status  Achieved        PT Long Term Goals - 11/10/17 1023      PT LONG TERM GOAL #1   Title  Independent with ongoing HEP    Status  Partially Met met for current HEP    Target Date  12/10/17      PT LONG TERM GOAL #2   Title  Pt will demonstrate adequate proximal LE muscle flexibility to allow for functional ROM at hip    Status  On-going    Target Date  12/10/17      PT LONG TERM GOAL #3   Title  L hip strength >/= 4+/5 for improved stability     Status  On-going    Target Date  12/10/17      PT LONG TERM GOAL #4   Title  Pt will report ability to stand or walk >/= 20-30 minutes w/o limitation due to L hip pain    Status  On-going    Target Date  12/10/17      PT LONG TERM GOAL #5   Title  Pt able to complete normal daily household chores and job tasks w/o limitation due to L hip pain or weakness    Status  On-going Continued poor tolerance for yardwork; remains out of work    Target Date  12/10/17            Plan - 11/15/17 1350    Clinical Impression Statement  Pt. reports he had f/u with MD with MD recommending lumbar cortisone injection for improvement in pain.  Pt. reporting MD wanting [place] to continue with therapy.  Injection scheduled for 12.11.  Reports he is still limited in walking and  standing tolerance by "buttocks" pain noting "it's about the same".  Pt. tolerated progression of standing hip/LE strengthening activities today however required sitting rest break x 1 due to reported "buttocks" pain.  Reports consistent home performance of foam rolling for glutes/ITB/quads at home and LE stretching.  Ended treatment with ice pack to buttocks area as pt. reports benefit from this.  Will continue to progress as pt. able in coming visits.      PT Treatment/Interventions  Patient/family education;ADLs/Self Care Home  Management;Therapeutic exercise;Therapeutic activities;Neuromuscular re-education;Manual techniques;Dry needling;Taping;Iontophoresis 95m/ml Dexamethasone;Electrical Stimulation;Cryotherapy;Moist Heat;Ultrasound;Passive range of motion    Consulted and Agree with Plan of Care  Patient       Patient will benefit from skilled therapeutic intervention in order to improve the following deficits and impairments:  Pain, Impaired flexibility, Increased muscle spasms, Increased fascial restricitons, Decreased strength, Difficulty walking, Decreased activity tolerance  Visit Diagnosis: Pain in left hip  Difficulty in walking, not elsewhere classified  Muscle weakness (generalized)     Problem List There are no active problems to display for this patient.   MBess Harvest PTA 11/16/17 6:05 AM  [facility] [place] [address]Phone: [phone]  Fax:  [phone] Name: Andrew MCQUIGGMRN: [id number] of Birth: 3Apr 02, 1962

## 2017-11-17 ENCOUNTER — Ambulatory Visit: Payer: 59 | Admitting: Physical Therapy

## 2017-11-17 ENCOUNTER — Encounter: Payer: Self-pay | Admitting: Physical Therapy

## 2017-11-17 DIAGNOSIS — R262 Difficulty in walking, not elsewhere classified: Secondary | ICD-10-CM

## 2017-11-17 DIAGNOSIS — M25552 Pain in left hip: Secondary | ICD-10-CM | POA: Diagnosis not present

## 2017-11-17 DIAGNOSIS — M6281 Muscle weakness (generalized): Secondary | ICD-10-CM

## 2017-11-17 NOTE — Therapy (Addendum)
[facility] [address]  [place], [place], [address] Phone: [phone]   Fax:  [phone]  Physical Therapy Treatment  Patient Details  Name: Andrew Blevins MRN: [id number] Date of Birth: 19-May-1961 Referring Provider: Aldean Ast, MD   Encounter Date: 11/17/2017  PT End of Session - 11/17/17 1100    Visit Number  18    Number of Visits  24    Date for PT Re-Evaluation  12/10/17    Authorization Type  UHC    Authorization - Number of Visits  55    PT Start Time  1100    PT Stop Time  1159    PT Time Calculation (min)  59 min    Activity Tolerance  Patient tolerated treatment well    Behavior During Therapy  [facility] for tasks assessed/performed       Past Medical History:  Diagnosis Date  . ED (erectile dysfunction)   . GERD (gastroesophageal reflux disease)   . History of Helicobacter pylori infection 02/2005  . Hypertension     Past Surgical History:  Procedure Laterality Date  . INGUINAL HERNIA REPAIR Left 1997  . SHOULDER SURGERY      There were no vitals filed for this visit.  Subjective Assessment - 11/17/17 1100    Subjective  Pt reporting benefit from DN with "loosening of the muscles".    Pertinent History  08/10/17 - L2 & L3 laminectomy (per pt report)    How long can you sit comfortably?  "It doesn't bother me sitting now"    How long can you stand comfortably?  5-10 minutes    How long can you walk comfortably?  60 yards     Patient Stated Goals  "get the strength back in the leg and get rid of the pain so I can stand or walk longer"    Currently in Pain?  Yes    Pain Score  5     Pain Location  Buttocks & ant thigh    Pain Orientation  Left;Lower    Pain Descriptors / Indicators  Throbbing    Pain Type  Acute pain    Pain Onset  1 to 4 weeks ago                      [facility] Adult PT Treatment/Exercise - 11/17/17 1100      Exercises   Exercises  Knee/Hip       Knee/Hip Exercises: Aerobic   Nustep  lvl 6 x 6' (LE only)      Knee/Hip Exercises: Standing   Hip Extension  Both;10 reps;Knee straight;Stengthening    Extension Limitations  at 45 dg angle to abduction with looped green TB    Functional Squat  15 reps;5 seconds    Functional Squat Limitations  TRX + hip abduction isometric with green TB    Other Standing Knee Exercises  B sidestepping & fwd/back monster walk with looped green TB at ankles 2 x 30 ft      Modalities   Modalities  Cryotherapy      Cryotherapy   Number Minutes Cryotherapy  10 Minutes    Cryotherapy Location  Hip    Type of Cryotherapy  Ice pack      Manual Therapy   Manual Therapy  Soft tissue mobilization;Myofascial release;Taping    Soft tissue mobilization  STM/DTM to L piriformis/glutes & distal mid/lat quads    Myofascial  Release  TPR to L piriformis, RF/VI & VL    Kinesiotex  Create Space      Kinesiotix   Create Space  L piriformis - 30% along muscle belly, 50% perpendicular       Trigger Point Dry Needling - 11/17/17 1100    Consent Given?  Yes    Muscles Treated Lower Body  Gluteus minimus;Gluteus maximus;Piriformis;Quadriceps    Gluteus Maximus Response  Twitch response elicited;Palpable increased muscle length    Gluteus Minimus Response  Twitch response elicited;Palpable increased muscle length    Piriformis Response  Twitch response elicited;Palpable increased muscle length    Quadriceps Response  Twitch response elicited;Palpable increased muscle length             PT Short Term Goals - 11/10/17 1022      PT SHORT TERM GOAL #1   Title  Independent with initial HEP     Status  Achieved      PT SHORT TERM GOAL #2   Title  Pt will report overall pain reduction in L hip by 50%    Status  Achieved        PT Long Term Goals - 11/10/17 1023      PT LONG TERM GOAL #1   Title  Independent with ongoing HEP    Status  Partially Met met for current HEP    Target Date  12/10/17      PT  LONG TERM GOAL #2   Title  Pt will demonstrate adequate proximal LE muscle flexibility to allow for functional ROM at hip    Status  On-going    Target Date  12/10/17      PT LONG TERM GOAL #3   Title  L hip strength >/= 4+/5 for improved stability     Status  On-going    Target Date  12/10/17      PT LONG TERM GOAL #4   Title  Pt will report ability to stand or walk >/= 20-30 minutes w/o limitation due to L hip pain    Status  On-going    Target Date  12/10/17      PT LONG TERM GOAL #5   Title  Pt able to complete normal daily household chores and job tasks w/o limitation due to L hip pain or weakness    Status  On-going Continued poor tolerance for yardwork; remains out of work    Target Date  12/10/17            Plan - 11/17/17 1149    Clinical Impression Statement  Pt noting benefit from prior session of DN and requesting to try this again today. DN and manual therapy performed to L glutes, piriformis and quads with decreased muscle tension observed and pt noting that "he feels looser" following DN with better exercise tolerance reported.     PT Treatment/Interventions  Patient/family education;ADLs/Self Care Home Management;Therapeutic exercise;Therapeutic activities;Neuromuscular re-education;Manual techniques;Dry needling;Taping;Iontophoresis 22m/ml Dexamethasone;Electrical Stimulation;Cryotherapy;Moist Heat;Ultrasound;Passive range of motion    Consulted and Agree with Plan of Care  Patient       Patient will benefit from skilled therapeutic intervention in order to improve the following deficits and impairments:  Pain, Impaired flexibility, Increased muscle spasms, Increased fascial restricitons, Decreased strength, Difficulty walking, Decreased activity tolerance  Visit Diagnosis: Pain in left hip  Difficulty in walking, not elsewhere classified  Muscle weakness (generalized)     Problem List There are no active problems to display for this  patient.  Percival Spanish, PT, MPT 11/17/2017, 1:43 PM  [facility] [address]  [place], [place], [address] Phone: [phone]   Fax:  [phone]  Name: Andrew Blevins MRN: [id number] Date of Birth: 1961-09-19  PHYSICAL THERAPY DISCHARGE SUMMARY  Visits from Start of Care: 18  Current functional level related to goals / functional outcomes:   Unable to formally assess status at discharge as pt failed to return for any further visits in > 30 day.   Remaining deficits:   As above.   Education / Equipment:   HEP  Plan: Patient agrees to discharge.  Patient goals were partially met. Patient is being discharged due to not returning since the last visit.  ?????     Percival Spanish, PT, MPT 01/18/18, 3:08 PM  [facility] [address]  [place], [place], [address] Phone: [phone]   Fax:  [phone]

## 2017-11-22 ENCOUNTER — Ambulatory Visit: Payer: 59

## 2017-11-24 ENCOUNTER — Ambulatory Visit: Payer: 59 | Admitting: Physical Therapy

## 2017-12-15 DIAGNOSIS — M5416 Radiculopathy, lumbar region: Secondary | ICD-10-CM | POA: Diagnosis not present

## 2017-12-15 DIAGNOSIS — I10 Essential (primary) hypertension: Secondary | ICD-10-CM | POA: Diagnosis not present

## 2017-12-15 DIAGNOSIS — M9983 Other biomechanical lesions of lumbar region: Secondary | ICD-10-CM | POA: Diagnosis not present

## 2017-12-23 DIAGNOSIS — M4727 Other spondylosis with radiculopathy, lumbosacral region: Secondary | ICD-10-CM | POA: Diagnosis not present

## 2017-12-23 DIAGNOSIS — Z6838 Body mass index (BMI) 38.0-38.9, adult: Secondary | ICD-10-CM | POA: Diagnosis not present

## 2017-12-23 DIAGNOSIS — M5416 Radiculopathy, lumbar region: Secondary | ICD-10-CM | POA: Diagnosis not present

## 2018-01-13 DIAGNOSIS — M7062 Trochanteric bursitis, left hip: Secondary | ICD-10-CM | POA: Diagnosis not present

## 2018-01-21 DIAGNOSIS — M5416 Radiculopathy, lumbar region: Secondary | ICD-10-CM | POA: Diagnosis not present

## 2018-01-26 ENCOUNTER — Other Ambulatory Visit: Payer: Self-pay | Admitting: Orthopedic Surgery

## 2018-01-26 DIAGNOSIS — M5416 Radiculopathy, lumbar region: Secondary | ICD-10-CM

## 2018-01-30 ENCOUNTER — Ambulatory Visit
Admission: RE | Admit: 2018-01-30 | Discharge: 2018-01-30 | Disposition: A | Discharge status: Home / Self Care | Payer: 59 | Source: Ambulatory Visit | Attending: Orthopedic Surgery | Admitting: Orthopedic Surgery

## 2018-01-30 ENCOUNTER — Other Ambulatory Visit: Payer: 59

## 2018-01-30 DIAGNOSIS — M48061 Spinal stenosis, lumbar region without neurogenic claudication: Secondary | ICD-10-CM | POA: Diagnosis not present

## 2018-01-30 DIAGNOSIS — M5416 Radiculopathy, lumbar region: Secondary | ICD-10-CM

## 2018-01-31 DIAGNOSIS — M5416 Radiculopathy, lumbar region: Secondary | ICD-10-CM | POA: Diagnosis not present

## 2018-02-08 DIAGNOSIS — M5416 Radiculopathy, lumbar region: Secondary | ICD-10-CM | POA: Diagnosis not present

## 2018-02-16 DIAGNOSIS — I10 Essential (primary) hypertension: Secondary | ICD-10-CM | POA: Diagnosis not present

## 2018-02-16 DIAGNOSIS — M5127 Other intervertebral disc displacement, lumbosacral region: Secondary | ICD-10-CM | POA: Diagnosis not present

## 2018-02-16 DIAGNOSIS — Z6838 Body mass index (BMI) 38.0-38.9, adult: Secondary | ICD-10-CM | POA: Diagnosis not present

## 2018-02-24 DIAGNOSIS — M5126 Other intervertebral disc displacement, lumbar region: Secondary | ICD-10-CM | POA: Diagnosis not present

## 2018-02-24 DIAGNOSIS — I10 Essential (primary) hypertension: Secondary | ICD-10-CM | POA: Diagnosis not present

## 2018-02-24 DIAGNOSIS — Z6838 Body mass index (BMI) 38.0-38.9, adult: Secondary | ICD-10-CM | POA: Diagnosis not present

## 2018-03-07 DIAGNOSIS — M5126 Other intervertebral disc displacement, lumbar region: Secondary | ICD-10-CM | POA: Diagnosis not present

## 2018-03-07 DIAGNOSIS — Z01812 Encounter for preprocedural laboratory examination: Secondary | ICD-10-CM | POA: Diagnosis not present

## 2018-03-18 DIAGNOSIS — M48061 Spinal stenosis, lumbar region without neurogenic claudication: Secondary | ICD-10-CM | POA: Diagnosis not present

## 2018-03-18 DIAGNOSIS — M5126 Other intervertebral disc displacement, lumbar region: Secondary | ICD-10-CM | POA: Diagnosis not present

## 2018-03-18 DIAGNOSIS — M5116 Intervertebral disc disorders with radiculopathy, lumbar region: Secondary | ICD-10-CM | POA: Diagnosis not present

## 2018-03-18 DIAGNOSIS — M4306 Spondylolysis, lumbar region: Secondary | ICD-10-CM | POA: Diagnosis not present

## 2018-04-12 ENCOUNTER — Other Ambulatory Visit: Payer: Self-pay

## 2018-04-12 ENCOUNTER — Ambulatory Visit: Payer: 59 | Attending: Neurosurgery | Admitting: Physical Therapy

## 2018-04-12 ENCOUNTER — Encounter: Payer: Self-pay | Admitting: Physical Therapy

## 2018-04-12 DIAGNOSIS — M6281 Muscle weakness (generalized): Secondary | ICD-10-CM | POA: Insufficient documentation

## 2018-04-12 DIAGNOSIS — M545 Low back pain, unspecified: Secondary | ICD-10-CM

## 2018-04-12 DIAGNOSIS — M62830 Muscle spasm of back: Secondary | ICD-10-CM | POA: Diagnosis not present

## 2018-04-12 NOTE — Therapy (Signed)
[facility] [address]  [address] [place], [place], [address] Phone: [phone]   Fax:  [phone]  Physical Therapy Evaluation  Patient Details  Name: Andrew Blevins MRN: [id number] Date of Birth: 09/06/1961 Referring Provider: Jillyn Hidden P. Wynetta Emery, MD   Encounter Date: 04/12/2018  PT End of Session - 04/12/18 1417    Visit Number  1    Number of Visits  12    Date for PT Re-Evaluation  05/24/18    Authorization Type  [employer]    PT Start Time  1316    PT Stop Time  1406    PT Time Calculation (min)  50 min    Activity Tolerance  Patient tolerated treatment well    Behavior During Therapy  WFL for tasks assessed/performed       Past Medical History:  Diagnosis Date  . ED (erectile dysfunction)   . GERD (gastroesophageal reflux disease)   . History of Helicobacter pylori infection 02/2005  . Hypertension     Past Surgical History:  Procedure Laterality Date  . INGUINAL HERNIA REPAIR Left 1997  . SHOULDER SURGERY      There were no vitals filed for this visit.   Subjective Assessment - 04/12/18 1317    Subjective  Pt. reporting that he was referred to PT after he had a lumbar surgery (approx. L5 region) on April 5th. Pt. stating that the surgery helped relieved the shooting pain down the L LE that he was experiencing prior to surgery. Now the pt. is reporting low back pain and weakness in the L leg. Also noting that he has had increased stiffness and muscle spasms in the low back as welll.     Limitations  Sitting    How long can you sit comfortably?  15 minutes    Patient Stated Goals  Strength back in L Leg; Relief of pain in Low Back    Currently in Pain?  Yes    Pain Score  5     Pain Location  Back    Pain Orientation  Mid;Lower    Pain Descriptors / Indicators  Throbbing    Pain Type  Surgical pain;Acute pain    Pain Onset  1 to 4 weeks ago    Pain Frequency  Intermittent    Aggravating Factors    Bending Down    Pain Relieving Factors  Ice/Heat    Effect of Pain on Daily Activities  Sitting in car for extended periods of time; not lifting anything currently         [facility] PT Assessment - 04/12/18 0001      Assessment   Medical Diagnosis  Herniated Lumbar Disc w/ Myelopathy    Referring Provider  Jillyn Hidden P. Wynetta Emery, MD    Onset Date/Surgical Date  03/18/18    Next MD Visit  May 21st, 2019    Prior Therapy  Yes (Low Back + Hip)      Balance Screen   Has the patient fallen in the past 6 months  No    Has the patient had a decrease in activity level because of a fear of falling?   No    Is the patient reluctant to leave their home because of a fear of falling?   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 3x/day)      Observation/Other Assessments   Focus on Therapeutic Outcomes (FOTO)   50% (50% Limitation); Predicted 61% (39% Predicted Limitation)      Posture/Postural Control   Posture/Postural Control  Postural limitations    Postural Limitations  Rounded Shoulders;Forward head      ROM / Strength   AROM / PROM / Strength  AROM;Strength      AROM   AROM Assessment Site  Lumbar    Lumbar Flexion  WFL    Lumbar Extension  70%  pain in L Lower back    Lumbar - Right Side Bend  WFL (fingertips at mid calf)    Lumbar - Left Side Bend  WFL (fingertips at mid calf)     Lumbar - Right Rotation  WFL    Lumbar - Left Rotation  WFL pain      Strength   Strength Assessment Site  Hip;Knee    Right/Left Hip  Right;Left    Right Hip Flexion  4+/5    Right Hip Extension  4/5    Right Hip External  Rotation   4+/5    Right Hip Internal Rotation  4+/5    Right Hip ABduction  4+/5    Right Hip ADduction  4+/5    Left Hip Flexion  4-/5    Left Hip Extension  4/5    Left Hip External Rotation  4-/5    Left Hip Internal Rotation  4/5    Left Hip ABduction  4/5    Left Hip ADduction  4/5    Right/Left Knee  Right;Left    Right Knee Flexion  5/5    Right Knee Extension  5/5    Left Knee Flexion  4/5    Left Knee Extension  4+/5      Flexibility   Soft Tissue Assessment /Muscle Length  yes    Hamstrings  Mild/Mod Tightness in B     Quadriceps  Moderate Tightness B + Moderate Hip Flexor Tightness B    ITB  Mild Tightness B    Piriformis  Moderate Tightness (R>L)        Palpation   Palpation comment  Moderate Tightness in L Paraspinals; TTP at L4/5 region near incision      Special Tests    Special Tests  Hip Special Tests    Hip Special Tests   Ober's Test      Ober's Test   Findings  Negative    Side  Right;Left                Objective measurements completed on examination: See above findings.      [facility] Adult PT Treatment/Exercise - 04/12/18 0001      Exercises   Exercises  Lumbar      Lumbar Exercises: Stretches   Single Knee to Chest Stretch  Left;2 reps;30 seconds    Other Lumbar Stretch Exercise  Prayer stretch w/ green pball (forward/right) 30"       Lumbar Exercises: Seated   Other Seated Lumbar Exercises  Alternating Marching x 15 reps w/ red TB at knees  PT Education - 04/12/18 1421    Education provided  Yes    Education Details  Pt. educated on evaluation findings, plan of care, and initial HEP    Person(s) Educated  Patient    Methods  Explanation;Demonstration;Handout    Comprehension  Verbalized understanding;Returned demonstration       PT Short Term Goals - 04/12/18 1426      PT SHORT TERM GOAL #1   Title  Independent w/ initial HEP    Status  New    Target Date  04/29/18        PT Long Term Goals -  04/12/18 1441      PT LONG TERM GOAL #1   Title  Independent w/ ongoing HEP    Status  New    Target Date  05/24/18      PT LONG TERM GOAL #2   Title  Pt. will demonstrate L hip strength >/= 4+/5 to improve proximal stability     Status  New    Target Date  05/24/18      PT LONG TERM GOAL #3   Title  Pt. will report ability to sit for >/= to 30 minutes w/o low back pain    Status  New    Target Date  05/24/18             Plan - 04/12/18 1427    Clinical Impression Statement  Andrew Blevins is a 57 y.o. male that was referred to PT for low back pain/stiffness, after lumbar surgery (approx. L5) that took place on 03/18/2018. Pt. reporting that the surgery improved the shooting pain he was previously experiencing into L LE, however currently has low back pain and L hip weakness post surgery. Pt. was TTP at L erector spinae w/ moderate tightness and surgical incision seemed to be healing well. Pt. demonstrating decreased AROM w/ lumbar extension, and experiencing pain w/ lumbar extension and L rotation. L hip strength decreased overall in comparison to the R. Pt. also had decreased flexibility w/ B LE. Pt. reporting that he is currently unable to work due to surgery, and plans to return upon follow up w/ MD. Pt. will benefit from skilled physical therapy to address the impairments, and allow for return/increased tolerance of functional activties.     History and Personal Factors relevant to plan of care:  Lumbar Surgeries (2)    Clinical Presentation  Evolving    Clinical Decision Making  Moderate    Rehab Potential  Good    PT Frequency  2x / week    PT Duration  6 weeks    PT Treatment/Interventions  Dry needling;ADLs/Self Care Home Management;Cryotherapy;Iontophoresis /ml Dexamethasone;Moist Heat;Ultrasound;Therapeutic exercise;Therapeutic activities;Neuromuscular re-education;Patient/family education;Manual techniques;Passive range of motion;Taping    Consulted and Agree with Plan of Care   Patient       Patient will benefit from skilled therapeutic intervention in order to improve the following deficits and impairments:  Pain, Increased muscle spasms, Decreased activity tolerance, Decreased endurance, Decreased range of motion, Decreased strength, Impaired flexibility, Postural dysfunction  Visit Diagnosis: Acute left-sided low back pain without sciatica  Muscle spasm of back  Muscle weakness (generalized)     Problem List There are no active problems to display for this patient.   Jethro Bastos, SPT 04/12/2018, 3:16 PM  [facility] [address]  [address] [place], [place], [address] Phone: [phone]   Fax:  [phone]  Name: Andrew Blevins MRN: [id number] Date  of Birth: [birth year]

## 2018-04-14 ENCOUNTER — Ambulatory Visit: Payer: 59 | Attending: Neurosurgery

## 2018-04-14 DIAGNOSIS — R293 Abnormal posture: Secondary | ICD-10-CM | POA: Insufficient documentation

## 2018-04-14 DIAGNOSIS — M545 Low back pain, unspecified: Secondary | ICD-10-CM

## 2018-04-14 DIAGNOSIS — M5412 Radiculopathy, cervical region: Secondary | ICD-10-CM | POA: Diagnosis present

## 2018-04-14 DIAGNOSIS — M6281 Muscle weakness (generalized): Secondary | ICD-10-CM

## 2018-04-14 DIAGNOSIS — M62830 Muscle spasm of back: Secondary | ICD-10-CM | POA: Insufficient documentation

## 2018-04-14 NOTE — Therapy (Signed)
[facility] [address]  [address] [place], [place], [address] Phone: [phone]   Fax:  [phone]  Physical Therapy Treatment  Patient Details  Name: Andrew Blevins MRN: [id number] Date of Birth: 20-Jun-1961 Referring Provider: Jillyn Hidden P. Wynetta Emery, MD   Encounter Date: 04/14/2018  PT End of Session - 04/14/18 1114    Visit Number  2    Number of Visits  12    Date for PT Re-Evaluation  05/24/18    Authorization Type  [employer]    PT Start Time  1103    PT Stop Time  1151 ended tx with 10 min moist heat    PT Time Calculation (min)  48 min    Activity Tolerance  Patient tolerated treatment well    Behavior During Therapy  WFL for tasks assessed/performed       Past Medical History:  Diagnosis Date  . ED (erectile dysfunction)   . GERD (gastroesophageal reflux disease)   . History of Helicobacter pylori infection 02/2005  . Hypertension     Past Surgical History:  Procedure Laterality Date  . INGUINAL HERNIA REPAIR Left 1997  . SHOULDER SURGERY      There were no vitals filed for this visit.  Subjective Assessment - 04/14/18 1111    Subjective  Pt. doing well today.      Patient Stated Goals  Strength back in L Leg; Relief of pain in Low Back    Currently in Pain?  Yes    Pain Score  5     Pain Location  Back    Pain Orientation  Left;Lower    Pain Descriptors / Indicators  Throbbing    Pain Type  Surgical pain;Acute pain    Pain Onset  1 to 4 weeks ago    Pain Frequency  Intermittent    Aggravating Factors   Bending down     Pain Relieving Factors  Ice/heat     Multiple Pain Sites  No                       [facility] Adult PT Treatment/Exercise - 04/14/18 1116      Lumbar Exercises: Stretches   Single Knee to Chest Stretch  Left;2 reps;30 seconds      Lumbar Exercises: Aerobic   Nustep  Lvl 4, 6 min       Lumbar Exercises: Seated   Sit to Stand  10 reps B UE pushoff from knees; red TB  at knees     Sit to Stand Limitations  with red looped TB at knees       Lumbar Exercises: Supine   Ab Set  10 reps;3 seconds    AB Set Limitations  with sustained hip abd/ER into red TB at knees    Clam  10 reps;3 seconds    Clam Limitations  red looped TB at knees     Bent Knee Raise  10 reps;3 seconds    Bent Knee Raise Limitations  red Looped TB at knees     Bridge  10 reps;3 seconds    Bridge Limitations  with sustained hip abd/ER isometrics into Red TB  Instructed to avoid end ROM at top of movement       Lumbar Exercises: Sidelying   Clam  Right;Left;10 reps    Clam Limitations  R with red looped TB, L with yellow looped TB       Modalities  Modalities  Moist Heat      Moist Heat Therapy   Number Minutes Moist Heat  10 Minutes    Moist Heat Location  Lumbar Spine               PT Short Term Goals - 04/14/18 1116      PT SHORT TERM GOAL #1   Title  Independent w/ initial HEP    Status  On-going        PT Long Term Goals - 04/14/18 1116      PT LONG TERM GOAL #1   Title  Independent w/ ongoing HEP    Status  On-going      PT LONG TERM GOAL #2   Title  Pt. will demonstrate L hip strength >/= 4+/5 to improve proximal stability     Status  On-going      PT LONG TERM GOAL #3   Title  Pt. will report ability to sit for >/= to 30 minutes w/o low back pain    Status  On-going            Plan - 04/14/18 1130    Clinical Impression Statement  [name] doing well today.  Reports no issues with HEP.   Tolerated progression of supine level lumbopelvic strengthening and addition of hip abd/ER band sit<>stand today well.  LBP remained unchanged with therex per pt. report.  Pt. with plans to attend water aerobics tomorrow.  Supervising PT inspecting incision with incision well closed and PT providing pt. approval for this activity.  Ended treatment with moist heat to lumbar spine to promote relaxation of lumbar musculature.      PT Treatment/Interventions  Dry  needling;ADLs/Self Care Home Management;Cryotherapy;Iontophoresis /ml Dexamethasone;Moist Heat;Ultrasound;Therapeutic exercise;Therapeutic activities;Neuromuscular re-education;Patient/family education;Manual techniques;Passive range of motion;Taping    Consulted and Agree with Plan of Care  Patient       Patient will benefit from skilled therapeutic intervention in order to improve the following deficits and impairments:  Pain, Increased muscle spasms, Decreased activity tolerance, Decreased endurance, Decreased range of motion, Decreased strength, Impaired flexibility, Postural dysfunction  Visit Diagnosis: Acute left-sided low back pain without sciatica  Muscle spasm of back  Muscle weakness (generalized)     Problem List There are no active problems to display for this patient.   Kermit Balo, PTA 04/14/18 11:54 AM  [facility] [address]  [address] [place], [place], [address] Phone: [phone]   Fax:  [phone]  Name: Andrew Blevins MRN: [id number] Date of Birth: 10/30/1961

## 2018-04-18 ENCOUNTER — Other Ambulatory Visit: Payer: Self-pay | Admitting: Neurosurgery

## 2018-04-18 DIAGNOSIS — M5412 Radiculopathy, cervical region: Secondary | ICD-10-CM

## 2018-04-19 ENCOUNTER — Ambulatory Visit: Payer: 59

## 2018-04-19 ENCOUNTER — Other Ambulatory Visit: Payer: Self-pay | Admitting: Neurosurgery

## 2018-04-19 ENCOUNTER — Ambulatory Visit
Admission: RE | Admit: 2018-04-19 | Discharge: 2018-04-19 | Disposition: A | Discharge status: Home / Self Care | Payer: 59 | Source: Ambulatory Visit | Attending: Neurosurgery | Admitting: Neurosurgery

## 2018-04-19 DIAGNOSIS — M6281 Muscle weakness (generalized): Secondary | ICD-10-CM

## 2018-04-19 DIAGNOSIS — M62830 Muscle spasm of back: Secondary | ICD-10-CM

## 2018-04-19 DIAGNOSIS — M4802 Spinal stenosis, cervical region: Secondary | ICD-10-CM | POA: Diagnosis not present

## 2018-04-19 DIAGNOSIS — M545 Low back pain, unspecified: Secondary | ICD-10-CM

## 2018-04-19 DIAGNOSIS — M5412 Radiculopathy, cervical region: Secondary | ICD-10-CM

## 2018-04-19 NOTE — Therapy (Signed)
[facility] [address]  [address] [place], [place], [address] Phone: [phone]   Fax:  [phone]  Physical Therapy Treatment  Patient Details  Name: Andrew Blevins MRN: [id number] Date of Birth: 1961-05-23 Referring Provider: Jillyn Hidden P. Wynetta Emery, MD   Encounter Date: 04/19/2018  PT End of Session - 04/19/18 1321    Visit Number  3    Number of Visits  12    Date for PT Re-Evaluation  05/24/18    Authorization Type  [employer]    PT Start Time  1316    PT Stop Time  1407 ended treatment with 10 min moist heat    PT Time Calculation (min)  51 min    Activity Tolerance  Patient tolerated treatment well    Behavior During Therapy  WFL for tasks assessed/performed       Past Medical History:  Diagnosis Date  . ED (erectile dysfunction)   . GERD (gastroesophageal reflux disease)   . History of Helicobacter pylori infection 02/2005  . Hypertension     Past Surgical History:  Procedure Laterality Date  . INGUINAL HERNIA REPAIR Left 1997  . SHOULDER SURGERY      There were no vitals filed for this visit.  Subjective Assessment - 04/19/18 1319    Subjective  Pt. reporting he has had increased back "stiffness" over past few days without known trigger.      Patient Stated Goals  Strength back in L Leg; Relief of pain in Low Back    Currently in Pain?  Yes    Pain Score  6     Pain Location  Back    Pain Orientation  Lower;Left    Pain Descriptors / Indicators  -- "Stiffness"    Pain Type  Surgical pain;Acute pain    Pain Onset  More than a month ago    Pain Frequency  Intermittent    Aggravating Factors   Bending down     Pain Relieving Factors  ice/heat     Multiple Pain Sites  No                       [facility] Adult PT Treatment/Exercise - 04/19/18 1325      Lumbar Exercises: Stretches   Single Knee to Chest Stretch  Left;2 reps;30 seconds    Lower Trunk Rotation  5 reps;10 seconds    Other  Lumbar Stretch Exercise  Prayer stretch w/ green pball (forward/right) 30"  performed due to reported muscle "tightness" after stepping      Lumbar Exercises: Aerobic   Stationary Bike  Lvl 2, 6 min       Lumbar Exercises: Standing   Other Standing Lumbar Exercises  L hip extension leaning over peanut p-ball x 10 reps        Lumbar Exercises: Supine   Bent Knee Raise  3 seconds;15 reps    Bent Knee Raise Limitations  red Looped TB at knees     Bridge  15 reps;3 seconds    Bridge Limitations  with sustained hip abd/ER isometrics into Red TB     Isometric Hip Flexion  3 seconds;10 reps    Isometric Hip Flexion Limitations  LE resting on peanut p-ball      Lumbar Exercises: Sidelying   Clam  Left x 12 reps     Clam Limitations  L only     Hip Abduction  Left;10 reps;3 seconds  Hip Abduction Limitations  Cues required for positioning and proper motion       Knee/Hip Exercises: Standing   Forward Step Up  Left;10 reps;Step Height: 6";Hand Hold: 1 1 ski pole     Forward Step Up Limitations  Focusing on slow eccentric step back       Moist Heat Therapy   Number Minutes Moist Heat  10 Minutes    Moist Heat Location  Lumbar Spine             PT Education - 04/19/18 1359    Education provided  Yes    Education Details  HEP update     Person(s) Educated  Patient    Methods  Explanation;Demonstration;Verbal cues;Handout    Comprehension  Verbalized understanding;Returned demonstration;Verbal cues required;Need further instruction       PT Short Term Goals - 04/14/18 1116      PT SHORT TERM GOAL #1   Title  Independent w/ initial HEP    Status  On-going        PT Long Term Goals - 04/14/18 1116      PT LONG TERM GOAL #1   Title  Independent w/ ongoing HEP    Status  On-going      PT LONG TERM GOAL #2   Title  Pt. will demonstrate L hip strength >/= 4+/5 to improve proximal stability     Status  On-going      PT LONG TERM GOAL #3   Title  Pt. will report ability  to sit for >/= to 30 minutes w/o low back pain    Status  On-going            Plan - 04/19/18 1321    Clinical Impression Statement  Andrew Blevins doing well today with primary complaint being lower back stiffness which decreased with therex,    Tolerated progression of LE/lumbopelvic strengthening activities well today in treatment with HEP updated accordingly.  Pt. does require verbal cueing with therex for upright posture.  Ended treatment with moist heat to lumbar spine as pt. noted good benefit from this last visit.      PT Treatment/Interventions  Dry needling;ADLs/Self Care Home Management;Cryotherapy;Iontophoresis /ml Dexamethasone;Moist Heat;Ultrasound;Therapeutic exercise;Therapeutic activities;Neuromuscular re-education;Patient/family education;Manual techniques;Passive range of motion;Taping    Consulted and Agree with Plan of Care  Patient       Patient will benefit from skilled therapeutic intervention in order to improve the following deficits and impairments:  Pain, Increased muscle spasms, Decreased activity tolerance, Decreased endurance, Decreased range of motion, Decreased strength, Impaired flexibility, Postural dysfunction  Visit Diagnosis: Acute left-sided low back pain without sciatica  Muscle spasm of back  Muscle weakness (generalized)     Problem List There are no active problems to display for this patient.   Kermit Balo, PTA 04/19/18 6:24 PM  [facility] [address]  [address] [place], [place], [address] Phone: [phone]   Fax:  [phone]  Name: Andrew Blevins MRN: [id number] Date of Birth: 1961/03/25

## 2018-04-21 ENCOUNTER — Ambulatory Visit: Payer: 59 | Admitting: Physical Therapy

## 2018-04-21 ENCOUNTER — Encounter: Payer: Self-pay | Admitting: Physical Therapy

## 2018-04-21 VITALS — BP 138/82 | HR 125

## 2018-04-21 DIAGNOSIS — M62830 Muscle spasm of back: Secondary | ICD-10-CM

## 2018-04-21 DIAGNOSIS — M6281 Muscle weakness (generalized): Secondary | ICD-10-CM

## 2018-04-21 DIAGNOSIS — M545 Low back pain, unspecified: Secondary | ICD-10-CM

## 2018-04-21 NOTE — Therapy (Signed)
[facility] [address]  [address] [place], [place], [address] Phone: [phone]   Fax:  [phone]  Physical Therapy Treatment  Patient Details  Name: Andrew Blevins MRN: [id number] Date of Birth: June 25, 1961 Referring Provider: Jillyn Hidden P. Wynetta Emery, MD   Encounter Date: 04/21/2018  PT End of Session - 04/21/18 1320    Visit Number  4    Number of Visits  12    Date for PT Re-Evaluation  05/24/18    Authorization Type  [employer]    PT Start Time  1320    PT Stop Time  1419    PT Time Calculation (min)  59 min    Activity Tolerance  Patient tolerated treatment well    Behavior During Therapy  WFL for tasks assessed/performed       Past Medical History:  Diagnosis Date  . ED (erectile dysfunction)   . GERD (gastroesophageal reflux disease)   . History of Helicobacter pylori infection 02/2005  . Hypertension     Past Surgical History:  Procedure Laterality Date  . INGUINAL HERNIA REPAIR Left 1997  . SHOULDER SURGERY      Vitals:   04/21/18 1419  BP: 138/82  Pulse: (!) 125  SpO2: 93%    Subjective Assessment - 04/21/18 1320    Subjective  Pt. reporting stiffness in lower back, especially on the L. Overall, pt. stating he feels well today.    Patient Stated Goals  Strength back in L Leg; Relief of pain in Low Back    Currently in Pain?  Yes    Pain Score  4     Pain Location  Back    Pain Orientation  Left    Pain Descriptors / Indicators  -- Stiffness    Pain Onset  More than a month ago                       [facility] Adult PT Treatment/Exercise - 04/21/18 1320      Lumbar Exercises: Aerobic   Recumbent Bike  L3 x 6'       Lumbar Exercises: Standing   Other Standing Lumbar Exercises  B Pallof Press w/ red TB x 15 reps    Other Standing Lumbar Exercises  Lateral Banded Walk w/ Red TB 30 ft x 2      Lumbar Exercises: Seated   Other Seated Lumbar Exercises  Seated on Green PBall (Pelvic  Tilts x 15 Reps, Marching x 15 Reps)      Lumbar Exercises: Supine   Dead Bug  -- attempted pt. unable to tolerate    Bridge with Andrew Blevins  15 reps;3 seconds;Limitations    Bridge with Andrew Blevins Limitations  w/ green ball    Other Supine Lumbar Exercises  yellow mball raise/curl x 15 reps    Other Supine Lumbar Exercises  lower trunk rotations w/ green pball x 15 reps      Lumbar Exercises: Quadruped   Madcat/Old Horse  15 reps w/ 3" hold      Modalities   Modalities  Electrical Stimulation;Moist Heat      Moist Heat Therapy   Number Minutes Moist Heat  15 Minutes    Moist Heat Location  Lumbar Spine      Electrical Stimulation   Electrical Stimulation Location  Lumbar Spine    Electrical Stimulation Action  IFC    Electrical Stimulation Parameters  80-150 Hz, 15 Minutes  Electrical Stimulation Goals  Pain               PT Short Term Goals - 04/14/18 1116      PT SHORT TERM GOAL #1   Title  Independent w/ initial HEP    Status  On-going        PT Long Term Goals - 04/14/18 1116      PT LONG TERM GOAL #1   Title  Independent w/ ongoing HEP    Status  On-going      PT LONG TERM GOAL #2   Title  Pt. will demonstrate L hip strength >/= 4+/5 to improve proximal stability     Status  On-going      PT LONG TERM GOAL #3   Title  Pt. will report ability to sit for >/= to 30 minutes w/o low back pain    Status  On-going            Plan - 04/21/18 1513    Clinical Impression Statement  Andrew Blevins reporting stiffness in the back today, however minimal. Continued progression of therapeutic exercises, targeted around strengthening of abdominals and thoracolumbar paraspinals. Overall pt. tolerated treatment well, however cueing required to remain upright with proper posture during completion of activities. Ended session w/ heat/estim to further decrease pain. After estim application, pt. requested PT take blood pressure due to feeling "feverish". All vitals  relatively normal, however pt. had elevated HR. Supervising PT encouraged pt. to follow up with primary physician.     PT Treatment/Interventions  Dry needling;ADLs/Self Care Home Management;Cryotherapy;Iontophoresis /ml Dexamethasone;Moist Heat;Ultrasound;Therapeutic exercise;Therapeutic activities;Neuromuscular re-education;Patient/family education;Manual techniques;Passive range of motion;Taping;Electrical Stimulation    Consulted and Agree with Plan of Care  Patient       Patient will benefit from skilled therapeutic intervention in order to improve the following deficits and impairments:  Pain, Increased muscle spasms, Decreased activity tolerance, Decreased endurance, Decreased range of motion, Decreased strength, Impaired flexibility, Postural dysfunction  Visit Diagnosis: Acute left-sided low back pain without sciatica  Muscle spasm of back  Muscle weakness (generalized)     Problem List There are no active problems to display for this patient.   Andrew Blevins, SPT 04/21/2018, 3:27 PM  [facility] [address]  [address] [place], [place], [address] Phone: [phone]   Fax:  [phone]  Name: Andrew Blevins MRN: [id number] Date of Birth: 29-Aug-1961

## 2018-04-25 ENCOUNTER — Ambulatory Visit: Payer: 59

## 2018-04-25 DIAGNOSIS — M545 Low back pain, unspecified: Secondary | ICD-10-CM

## 2018-04-25 DIAGNOSIS — M6281 Muscle weakness (generalized): Secondary | ICD-10-CM

## 2018-04-25 DIAGNOSIS — M62830 Muscle spasm of back: Secondary | ICD-10-CM

## 2018-04-25 NOTE — Therapy (Signed)
[facility] [address]  [address] [place], [place], [address] Phone: [phone]   Fax:  [phone]  Physical Therapy Treatment  Patient Details  Name: Andrew Blevins MRN: [id number] Date of Birth: 08-25-1961 Referring Provider: Jillyn Hidden P. Wynetta Emery, MD   Encounter Date: 04/25/2018  PT End of Session - 04/25/18 1425    Visit Number  5    Number of Visits  12    Date for PT Re-Evaluation  05/24/18    Authorization Type  [employer]    PT Start Time  1413 pt. arrived late to session     PT Stop Time  1500    PT Time Calculation (min)  47 min    Activity Tolerance  Patient tolerated treatment well    Behavior During Therapy  WFL for tasks assessed/performed       Past Medical History:  Diagnosis Date  . ED (erectile dysfunction)   . GERD (gastroesophageal reflux disease)   . History of Helicobacter pylori infection 02/2005  . Hypertension     Past Surgical History:  Procedure Laterality Date  . INGUINAL HERNIA REPAIR Left 1997  . SHOULDER SURGERY      There were no vitals filed for this visit.  Subjective Assessment - 04/25/18 1417    Subjective  Kyaire doing well today reporting he walked a lot with graduations over weekend.      Patient Stated Goals  Strength back in L Leg; Relief of pain in Low Back    Currently in Pain?  Yes    Pain Score  4     Pain Location  Back    Pain Orientation  Left    Pain Type  Surgical pain;Acute pain    Pain Onset  More than a month ago    Pain Frequency  Intermittent    Aggravating Factors   Bending down     Pain Relieving Factors  ice/heat     Multiple Pain Sites  No                       [facility] Adult PT Treatment/Exercise - 04/25/18 1427      Lumbar Exercises: Stretches   Lumbar Stabilization Level 1  2 reps;20 seconds Seated 3-way lumbar red p-ball rollout      Lumbar Exercises: Aerobic   Recumbent Bike  L3 x 6'       Lumbar Exercises: Seated   Hip  Flexion on Ball  Right;Left;15 reps    Hip Flexion on Ball Limitations  seated on red p-ball     Sit to Stand  10 reps    Sit to Stand Limitations  from low machine seat with green looped TB at knees       Lumbar Exercises: Supine   Isometric Hip Flexion  10 reps;5 seconds    Isometric Hip Flexion Limitations  LE resting on peanut p-ball      Modalities   Modalities  Electrical Stimulation;Moist Heat      Moist Heat Therapy   Number Minutes Moist Heat  15 Minutes    Moist Heat Location  Lumbar Spine      Electrical Stimulation   Electrical Stimulation Location  Lumbar Spine    Electrical Stimulation Action  IFC    Electrical Stimulation Parameters  80-150Hz , intensity to pt. tolerance, 15'     Electrical Stimulation Goals  Pain      Manual Therapy   Manual  Therapy  Soft tissue mobilization    Manual therapy comments  L sidelying     Soft tissue mobilization  B lumbar paraspinal STM with ball; some tenderness however pt. reporting good relief with this               PT Short Term Goals - 04/25/18 1438      PT SHORT TERM GOAL #1   Title  Independent w/ initial HEP    Status  Achieved        PT Long Term Goals - 04/14/18 1116      PT LONG TERM GOAL #1   Title  Independent w/ ongoing HEP    Status  On-going      PT LONG TERM GOAL #2   Title  Pt. will demonstrate L hip strength >/= 4+/5 to improve proximal stability     Status  On-going      PT LONG TERM GOAL #3   Title  Pt. will report ability to sit for >/= to 30 minutes w/o low back pain    Status  On-going            Plan - 04/25/18 1433    Clinical Impression Statement  Andrew Blevins arrived late to therapy thus session time limited.  Reports ~ two days relief from back pain following E-stim/moist heat last visit.  Tolerated addition of sit<>stand, Seated p-ball march, and supported hip flexion isometrics well today.  No answer back regarding insurance coverage for home TENS unit yet.  Ended treatment with  E-stim/moist heat to lumbar spine per pt. request to decrease post-exercise soreness.  Will continue to progress toward goals.      PT Treatment/Interventions  Dry needling;ADLs/Self Care Home Management;Cryotherapy;Iontophoresis /ml Dexamethasone;Moist Heat;Ultrasound;Therapeutic exercise;Therapeutic activities;Neuromuscular re-education;Patient/family education;Manual techniques;Passive range of motion;Taping;Electrical Stimulation    PT Next Visit Plan  Attempt leg press    Consulted and Agree with Plan of Care  Patient       Patient will benefit from skilled therapeutic intervention in order to improve the following deficits and impairments:  Pain, Increased muscle spasms, Decreased activity tolerance, Decreased endurance, Decreased range of motion, Decreased strength, Impaired flexibility, Postural dysfunction  Visit Diagnosis: Acute left-sided low back pain without sciatica  Muscle spasm of back  Muscle weakness (generalized)     Problem List There are no active problems to display for this patient.   Kermit Balo, PTA 04/25/18 3:09 PM  [facility] [address]  [address] [place], [place], [address] Phone: [phone]   Fax:  [phone]  Name: Andrew Blevins MRN: [id number] Date of Birth: [birth year]

## 2018-04-27 ENCOUNTER — Ambulatory Visit: Payer: 59

## 2018-04-27 DIAGNOSIS — M545 Low back pain, unspecified: Secondary | ICD-10-CM

## 2018-04-27 DIAGNOSIS — M6281 Muscle weakness (generalized): Secondary | ICD-10-CM

## 2018-04-27 DIAGNOSIS — M62830 Muscle spasm of back: Secondary | ICD-10-CM

## 2018-04-27 NOTE — Therapy (Signed)
[facility] [address]  [address] [place], [place], [address] Phone: [phone]   Fax:  [phone]  Physical Therapy Treatment  Patient Details  Name: Andrew Blevins MRN: [id number] Date of Birth: [birth year] Referring Provider: Jillyn Hidden P. Wynetta Emery, MD   Encounter Date: 04/27/2018  PT End of Session - 04/27/18 1325    Visit Number  6    Number of Visits  12    Date for PT Re-Evaluation  05/24/18    Authorization Type  [employer]    PT Start Time  1317    PT Stop Time  1410 ended with 10 min moist heat     PT Time Calculation (min)  53 min    Activity Tolerance  Patient tolerated treatment well    Behavior During Therapy  WFL for tasks assessed/performed       Past Medical History:  Diagnosis Date  . ED (erectile dysfunction)   . GERD (gastroesophageal reflux disease)   . History of Helicobacter pylori infection 02/2005  . Hypertension     Past Surgical History:  Procedure Laterality Date  . INGUINAL HERNIA REPAIR Left 1997  . SHOULDER SURGERY      There were no vitals filed for this visit.  Subjective Assessment - 04/27/18 1320    Subjective  Pt. reporting ~ 75 % reduction in overall pain since starting therapy.      How long can you sit comfortably?  25 min     How long can you stand comfortably?  30 min     How long can you walk comfortably?  20 min     Patient Stated Goals  Strength back in L Leg; Relief of pain in Low Back    Currently in Pain?  No/denies    Pain Score  0-No pain up to 5/10 back pain at worst with bending     Pain Location  Back    Pain Orientation  Left;Mid    Pain Descriptors / Indicators  Tightness    Pain Type  Surgical pain;Acute pain    Pain Onset  More than a month ago    Multiple Pain Sites  No                       [facility] Adult PT Treatment/Exercise - 04/27/18 1331      Lumbar Exercises: Aerobic   Recumbent Bike  L3 x 6'       Lumbar Exercises: Machines  for Strengthening   Cybex Knee Flexion  B con/ L ecc 25# x 10 reps     Leg Press  B LE's 25# x 15 reps       Lumbar Exercises: Standing   Other Standing Lumbar Exercises  L hip abduction, extension 2# standing at counter x 10 reps each way     Other Standing Lumbar Exercises  B side stepping, monster walk with red TB at ankles 2 x 30 ft  Cues required for upright posture and abdom. bracing       Lumbar Exercises: Supine   Straight Leg Raise  15 reps;3 seconds    Straight Leg Raises Limitations  Cues required      Lumbar Exercises: Sidelying   Clam  Left;10 reps;3 seconds    Clam Limitations  red looped TB at knees     Other Sidelying Lumbar Exercises  B "open book" stretch 5" x 5 reps each way  Moist Heat Therapy   Number Minutes Moist Heat  10 Minutes    Moist Heat Location  Lumbar Spine               PT Short Term Goals - 04/25/18 1438      PT SHORT TERM GOAL #1   Title  Independent w/ initial HEP    Status  Achieved        PT Long Term Goals - 04/14/18 1116      PT LONG TERM GOAL #1   Title  Independent w/ ongoing HEP    Status  On-going      PT LONG TERM GOAL #2   Title  Pt. will demonstrate L hip strength >/= 4+/5 to improve proximal stability     Status  On-going      PT LONG TERM GOAL #3   Title  Pt. will report ability to sit for >/= to 30 minutes w/o low back pain    Status  On-going            Plan - 04/27/18 1328    Clinical Impression Statement  Andrew Blevins reporting improvement in LBP levels since last visit.  Now reporting ~ 75 % improvement in pain since starting therapy.  Reports, "I feel like my legs are getting stronger".  Notes he is able to sit comfortably for 25 min now.  Tolerated progression of strengthening activities today well without pain.  Did require cueing for proper positioning and pacing with strengthening activities to ensure appropriate loading of musculature.  Ended treatment pain free thus only applied moist heat to  lumbar spine per pt. request as he notes good relief for remainder of day following this.  Pt. progressing well.        PT Treatment/Interventions  Dry needling;ADLs/Self Care Home Management;Cryotherapy;Iontophoresis /ml Dexamethasone;Moist Heat;Ultrasound;Therapeutic exercise;Therapeutic activities;Neuromuscular re-education;Patient/family education;Manual techniques;Passive range of motion;Taping;Electrical Stimulation    Consulted and Agree with Plan of Care  Patient       Patient will benefit from skilled therapeutic intervention in order to improve the following deficits and impairments:  Pain, Increased muscle spasms, Decreased activity tolerance, Decreased endurance, Decreased range of motion, Decreased strength, Impaired flexibility, Postural dysfunction  Visit Diagnosis: Acute left-sided low back pain without sciatica  Muscle spasm of back  Muscle weakness (generalized)     Problem List There are no active problems to display for this patient.   Andrew Blevins, PTA 04/27/18 2:19 PM  [facility] [address]  [address] [place], [place], [address] Phone: [phone]   Fax:  [phone]  Name: Andrew Blevins MRN: [id number] Date of Birth: 06/28/1961

## 2018-05-03 ENCOUNTER — Ambulatory Visit: Payer: 59

## 2018-05-03 DIAGNOSIS — M545 Low back pain, unspecified: Secondary | ICD-10-CM

## 2018-05-03 DIAGNOSIS — M62830 Muscle spasm of back: Secondary | ICD-10-CM

## 2018-05-03 DIAGNOSIS — M6281 Muscle weakness (generalized): Secondary | ICD-10-CM

## 2018-05-03 NOTE — Therapy (Addendum)
[name] [address]  [place], [place], [address] Phone: [phone]   Fax:  [phone]  Physical Therapy Treatment  Patient Details  Name: Andrew Blevins MRN: [id number] Date of Birth: Aug 19, 1961 Referring Provider: Dominica Severin P. Saintclair Halsted, MD   Encounter Date: 05/03/2018  PT End of Session - 05/03/18 0936    Visit Number  7    Number of Visits  12    Date for PT Re-Evaluation  05/24/18    Authorization Type  [employer]    PT Start Time  [phone]    PT Stop Time  1025 Ended tx with 10 min moist heat    PT Time Calculation (min)  56 min    Activity Tolerance  Patient tolerated treatment well    Behavior During Therapy  WFL for tasks assessed/performed       Past Medical History:  Diagnosis Date  . ED (erectile dysfunction)   . GERD (gastroesophageal reflux disease)   . History of Helicobacter pylori infection 02/2005  . Hypertension     Past Surgical History:  Procedure Laterality Date  . INGUINAL HERNIA REPAIR Left 1997  . SHOULDER SURGERY      There were no vitals filed for this visit.  Subjective Assessment - 05/03/18 0932    Subjective  Pt. doing well today.  Reports improved tolerance for sitting activities with less frequent back pain.      How long can you sit comfortably?  45 min     How long can you stand comfortably?  30 min     How long can you walk comfortably?  20 min     Patient Stated Goals  Strength back in L Leg; Relief of pain in Low Back    Currently in Pain?  Yes    Pain Score  5     Pain Location  Back    Pain Orientation  Mid;Left    Pain Descriptors / Indicators  -- "stiffness"    Pain Type  Acute pain;Surgical pain    Pain Onset  More than a month ago    Pain Frequency  Intermittent    Aggravating Factors   bending down, reaching up (to get something off of shelf)    Pain Relieving Factors  Ice/heat     Effect of Pain on Daily Activities  Still not lifting anything currently,  limits yard work    Multiple Pain Sites  No         [facility] PT Assessment - 05/03/18 0938      Strength   Strength Assessment Site  Hip;Knee    Right/Left Hip  Right;Left    Right Hip Flexion  4+/5    Right Hip Extension  4/5    Right Hip External Rotation   4+/5    Right Hip Internal Rotation  4+/5    Right Hip ABduction  4+/5    Right Hip ADduction  4+/5    Left Hip Flexion  4/5    Left Hip Extension  4/5    Left Hip External Rotation  4/5    Left Hip Internal Rotation  4/5    Left Hip ABduction  4+/5    Left Hip ADduction  4+/5    Right/Left Knee  Right;Left    Right Knee Flexion  5/5    Right Knee Extension  5/5    Left Knee Flexion  4/5    Left Knee Extension  5/5  [facility] Adult PT Treatment/Exercise - 05/03/18 0951      Lumbar Exercises: Stretches   Hip Flexor Stretch  2 reps;Right;Left;30 seconds    Hip Flexor Stretch Limitations  half lunge on chair       Lumbar Exercises: Aerobic   Recumbent Bike  L3 x 6'       Lumbar Exercises: Supine   Bridge  15 reps;3 seconds    Bridge Limitations  with heels on table     Other Supine Lumbar Exercises  Hooklying L hip abd/ER with green TB x 15 reps       Lumbar Exercises: Sidelying   Other Sidelying Lumbar Exercises  B "open book" stretch 5" x 5 reps each way       Knee/Hip Exercises: Standing   Hip Flexion  Left;10 reps;Knee straight;Stengthening    Hip Flexion Limitations  yellow TB at ankle; 2 ski poles     Hip ADduction  Left;10 reps;Strengthening    Hip ADduction Limitations  yellow TB at ankle; 2 ski poles     Hip Abduction  Left;10 reps;Stengthening;Knee straight    Abduction Limitations  yellow TB at ankle; 2 ski poles     Hip Extension  Left;10 reps;Knee straight;Stengthening    Extension Limitations  yellow TB at ankle; 2 ski poles     Forward Step Up  Left;10 reps;Step Height: 6";Hand Hold: 0    Forward Step Up Limitations  Focusing on slow eccentric step back       Moist Heat  Therapy   Number Minutes Moist Heat  10 Minutes    Moist Heat Location  Lumbar Spine      Manual Therapy   Manual Therapy  Soft tissue mobilization    Soft tissue mobilization  B lumbar paraspinal STM; some tenderness              PT Education - 05/03/18 1229    Education provided  Yes    Education Details  lunge hip flexion stretch with knee on chair     Person(s) Educated  Patient    Methods  Explanation;Demonstration;Verbal cues;Handout    Comprehension  Verbalized understanding;Returned demonstration;Verbal cues required;Need further instruction       PT Short Term Goals - 04/25/18 1438      PT SHORT TERM GOAL #1   Title  Independent w/ initial HEP    Status  Achieved        PT Long Term Goals - 05/03/18 0948      PT LONG TERM GOAL #1   Title  Independent w/ ongoing HEP    Status  Partially Met met for current HEP      PT LONG TERM GOAL #2   Title  Pt. will demonstrate L hip strength >/= 4+/5 to improve proximal stability     Status  Partially Met met for L hip add, abd 4+/5      PT LONG TERM GOAL #3   Title  Pt. will report ability to sit for >/= to 30 minutes w/o low back pain    Status  Achieved            Plan - 05/03/18 0951    Clinical Impression Statement  [name] reporting improved tolerance for prolonged sitting with less LBP.  Tolerated progression of hip strengthening activities today well.  Was able to partially meet L hip strength goal today with MMT.  Now reporting he is able to sit for 30 min without feeling limited by LBP.  Progressing well and to see MD later today for f/u.       PT Treatment/Interventions  Dry needling;ADLs/Self Care Home Management;Cryotherapy;Iontophoresis 52m/ml Dexamethasone;Moist Heat;Ultrasound;Therapeutic exercise;Therapeutic activities;Neuromuscular re-education;Patient/family education;Manual techniques;Passive range of motion;Taping;Electrical Stimulation    Consulted and Agree with Plan of Care  Patient        Patient will benefit from skilled therapeutic intervention in order to improve the following deficits and impairments:  Pain, Increased muscle spasms, Decreased activity tolerance, Decreased endurance, Decreased range of motion, Decreased strength, Impaired flexibility, Postural dysfunction  Visit Diagnosis: Acute left-sided low back pain without sciatica  Muscle spasm of back  Muscle weakness (generalized)     Problem List There are no active problems to display for this patient.   MBess Harvest PTA 05/03/18 12:30 PM  [facility] [address] [place] [address]Phone: [phone]  Fax:  [phone] Name: PNAYIB REMERMRN: [id number] of Birth: 3Sep 10, 1962

## 2018-05-05 ENCOUNTER — Ambulatory Visit: Payer: 59

## 2018-05-05 DIAGNOSIS — M62830 Muscle spasm of back: Secondary | ICD-10-CM

## 2018-05-05 DIAGNOSIS — M545 Low back pain, unspecified: Secondary | ICD-10-CM

## 2018-05-05 DIAGNOSIS — M79605 Pain in left leg: Secondary | ICD-10-CM | POA: Diagnosis not present

## 2018-05-05 DIAGNOSIS — M6281 Muscle weakness (generalized): Secondary | ICD-10-CM

## 2018-05-05 NOTE — Therapy (Signed)
[facility] [address]  [place], [place], [address] Phone: [phone]   Fax:  [phone]  Physical Therapy Treatment  Patient Details  Name: Andrew Blevins MRN: [id number] Date of Birth: [birth year] Referring Provider: Dominica Severin P. Saintclair Halsted, MD   Encounter Date: 05/05/2018  PT End of Session - 05/05/18 1411    Visit Number  8    Number of Visits  12    Date for PT Re-Evaluation  05/24/18    Authorization Type  [employer]    PT Start Time  1402    PT Stop Time  1452    PT Time Calculation (min)  50 min    Activity Tolerance  Patient tolerated treatment well    Behavior During Therapy  WFL for tasks assessed/performed       Past Medical History:  Diagnosis Date  . ED (erectile dysfunction)   . GERD (gastroesophageal reflux disease)   . History of Helicobacter pylori infection 02/2005  . Hypertension     Past Surgical History:  Procedure Laterality Date  . INGUINAL HERNIA REPAIR Left 1997  . SHOULDER SURGERY      There were no vitals filed for this visit.  Subjective Assessment - 05/05/18 1410    Subjective  Andrew Blevins doing well today noting TENS rep contacted him and anticipates recieving TENS unit today in session.      Patient Stated Goals  Strength back in L Leg; Relief of pain in Low Back    Currently in Pain?  Yes    Pain Score  7     Pain Location  Back    Pain Orientation  Left;Mid    Pain Descriptors / Indicators  Throbbing    Pain Type  Acute pain;Surgical pain    Pain Onset  More than a month ago    Pain Frequency  Intermittent    Aggravating Factors   Bending down     Pain Relieving Factors  ice/heat    Multiple Pain Sites  No                       [facility] Adult PT Treatment/Exercise - 05/05/18 1449      Self-Care   Self-Care  Other Self-Care Comments    Other Self-Care Comments   Instruction on proper use and setup of TENS IT unit; pt. able to demo and verbalize  understanding       Lumbar Exercises: Aerobic   Recumbent Bike  L3 x 6'       Lumbar Exercises: Standing   Functional Squats  15 reps;3 seconds    Functional Squats Limitations  chair       Knee/Hip Exercises: Standing   Hip Flexion  Right;Left;10 reps;Knee straight Instructed pt. to maintain abdom. bracing forneutral pelvis     Hip Flexion Limitations  red looped TB; chair     Hip Abduction  Right;Left;10 reps;Knee straight Instructed pt. to maintain abdom. bracing forneutral pelvis     Abduction Limitations  red looped TB; chair    Hip Extension  Right;Left;10 reps;Knee straight;Stengthening Instructed pt. to maintain abdom. bracing forneutral pelvis     Extension Limitations  red looped TB; chair       Moist Heat Therapy   Number Minutes Moist Heat  10 Minutes    Moist Heat Location  Lumbar Spine               PT Short Term  Goals - 04/25/18 1438      PT SHORT TERM GOAL #1   Title  Independent w/ initial HEP    Status  Achieved        PT Long Term Goals - 05/03/18 0948      PT LONG TERM GOAL #1   Title  Independent w/ ongoing HEP    Status  Partially Met met for current HEP      PT LONG TERM GOAL #2   Title  Pt. will demonstrate L hip strength >/= 4+/5 to improve proximal stability     Status  Partially Met met for L hip add, abd 4+/5      PT LONG TERM GOAL #3   Title  Pt. will report ability to sit for >/= to 30 minutes w/o low back pain    Status  Achieved            Plan - 05/05/18 1840    Clinical Impression Statement  Will doing well today.  Pt. approved for TENS IT unit by insurance today thus pt. issued unit in session and proper setup and use reviewed with pt. for pain relief.  Pt. able to demo understanding of proper use.  Tolerated mild advancement of lumbopelvic strengthening activities well today in session and reports ~ 70% improvement in pain levels since starting therapy.  Will plan to further review proper TENS unit use and issue  general TENS unit educational handout and proper electrode placement in coming visits per pt. understanding.    PT Treatment/Interventions  Dry needling;ADLs/Self Care Home Management;Cryotherapy;Iontophoresis 57m/ml Dexamethasone;Moist Heat;Ultrasound;Therapeutic exercise;Therapeutic activities;Neuromuscular re-education;Patient/family education;Manual techniques;Passive range of motion;Taping;Electrical Stimulation    PT Next Visit Plan  Issue electrode placement handout for TENS unit     Consulted and Agree with Plan of Care  Patient       Patient will benefit from skilled therapeutic intervention in order to improve the following deficits and impairments:  Pain, Increased muscle spasms, Decreased activity tolerance, Decreased endurance, Decreased range of motion, Decreased strength, Impaired flexibility, Postural dysfunction  Visit Diagnosis: Acute left-sided low back pain without sciatica  Muscle spasm of back  Muscle weakness (generalized)     Problem List There are no active problems to display for this patient.   MBess Harvest PTA 05/05/18 6:50 PM  [facility] [address] [place] [address]Phone: [phone]  Fax:  [phone] Name: Andrew BIEGELMRN: [id number] of Birth: 3Aug 15, 1962

## 2018-05-10 ENCOUNTER — Ambulatory Visit: Payer: 59 | Admitting: Physical Therapy

## 2018-05-10 ENCOUNTER — Encounter: Payer: Self-pay | Admitting: Physical Therapy

## 2018-05-10 DIAGNOSIS — M545 Low back pain, unspecified: Secondary | ICD-10-CM

## 2018-05-10 DIAGNOSIS — M62830 Muscle spasm of back: Secondary | ICD-10-CM

## 2018-05-10 DIAGNOSIS — M5412 Radiculopathy, cervical region: Secondary | ICD-10-CM

## 2018-05-10 DIAGNOSIS — R293 Abnormal posture: Secondary | ICD-10-CM

## 2018-05-10 DIAGNOSIS — M6281 Muscle weakness (generalized): Secondary | ICD-10-CM

## 2018-05-10 NOTE — Therapy (Signed)
[facility] [address]  [place], [place], [address] Phone: [phone]   Fax:  [phone]  Physical Therapy Treatment  Patient Details  Name: Andrew Blevins MRN: [id number] Date of Birth: 08-04-1961 Referring Provider: Dominica Severin P. Saintclair Halsted, MD   Encounter Date: 05/10/2018  PT End of Session - 05/10/18 1105    Visit Number  9    Number of Visits  20    Date for PT Re-Evaluation  06/21/18    Authorization Type  [employer]    PT Start Time  1105    PT Stop Time  1205    PT Time Calculation (min)  60 min    Activity Tolerance  Patient tolerated treatment well    Behavior During Therapy  WFL for tasks assessed/performed       Past Medical History:  Diagnosis Date  . ED (erectile dysfunction)   . GERD (gastroesophageal reflux disease)   . History of Helicobacter pylori infection 02/2005  . Hypertension     Past Surgical History:  Procedure Laterality Date  . INGUINAL HERNIA REPAIR Left 1997  . SHOULDER SURGERY      There were no vitals filed for this visit.  Subjective Assessment - 05/10/18 1109    Subjective  Pt wishing to proceed with neck evaluation today.    Diagnostic tests  Cervical MRI 04/19/18: 1. 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 Notable right C4-5 facet arthritis with moderate marrow edema and mild anterolisthesis; 3. C5-6 spinal stenosis with ventral cord flattening. Right more than left high-grade foraminal impingement; 4. Moderate foraminal narrowing seen on the left at C3-4, bilaterally at C4-5, and left at C6-7.    Patient Stated Goals  Strength back in L Leg; Relief of pain in Low Back    Currently in Pain?  Yes    Pain Score  5     Pain Location  Back    Pain Orientation  Left;Mid    Pain Descriptors / Indicators  -- "stiff"    Pain Type  Acute pain;Surgical pain    Multiple Pain Sites  Yes    Pain Score  0 4/10 when turning head to L    Pain Location  Neck     Pain Orientation  Left    Pain Descriptors / Indicators  Shooting    Pain Type  Acute pain    Pain Onset  More than a month ago Mid April    Pain Frequency  Intermittent    Aggravating Factors   turning head to left    Pain Relieving Factors  keeping head straight    Effect of Pain on Daily Activities  trouble checking blind spot while driving         [facility] PT Assessment - 05/10/18 1105      Assessment   Medical Diagnosis  Cervical radiculopathy at C6    Referring Provider  Dominica Severin P. Saintclair Halsted, MD    Onset Date/Surgical Date  -- mid April ~1 wk after back surgery    Hand Dominance  Right    Next MD Visit  06/03/18      Prior Function   Level of Independence  Independent    Vocation  Full time employment;Other (comment) currently not working due to surgery    Cytogeneticist (Standing majority of Work Day, occasionally lifting)     Leisure  Watch sports; [facility] [employer] (approx[place]week[place]  Posture/Postural Control   Posture/Postural Control  Postural limitations    Postural Limitations  Rounded Shoulders;Forward head      ROM / Strength   AROM / PROM / Strength  AROM;Strength      AROM   Overall AROM Comments  B shoulder ROM WFL - limited only by muscle mass    AROM Assessment Site  Cervical;Shoulder    Cervical Flexion  50    Cervical Extension  39    Cervical - Right Side Bend  33    Cervical - Left Side Bend  28    Cervical - Right Rotation  62    Cervical - Left Rotation  45      Strength   Strength Assessment Site  Shoulder    Right/Left Shoulder  Right;Left    Right Shoulder Flexion  5/5    Right Shoulder ABduction  5/5    Right Shoulder Internal Rotation  5/5    Right Shoulder External Rotation  5/5    Left Shoulder Flexion  4+/5    Left Shoulder ABduction  4+/5    Left Shoulder Internal Rotation  5/5    Left Shoulder External Rotation  5/5      Palpation   Palpation comment  Increased muscle tension/mild ttp in UT & pecs (L>R)                    [facility] Adult PT Treatment/Exercise - 05/10/18 1105      Self-Care   Self-Care  Posture    Posture  Educated pt on proper sleeping posture and use of pillows to maintain neutral cervical spine while sleeping.      Neck Exercises: Seated   Neck Retraction  10 reps;5 secs      Neck Exercises: Supine   Neck Retraction  10 reps;5 secs    Neck Retraction Limitations  into thin pillow      Modalities   Modalities  Electrical Stimulation;Moist Heat      Moist Heat Therapy   Number Minutes Moist Heat  10 Minutes    Moist Heat Location  Lumbar Spine      Electrical Stimulation   Electrical Stimulation Location  Lumbar Spine    Electrical Stimulation Action  IFC    Electrical Stimulation Parameters  80-150 Hz, intensity to pt tol x 10'    Electrical Stimulation Goals  Pain      Manual Therapy   Manual Therapy  Soft tissue mobilization;Joint mobilization;Manual Traction;Passive ROM    Manual therapy comments  hooklying    Joint Mobilization  cervical CPAs - good joint motion    Soft tissue mobilization  B cervical paraspinals & UT    Passive ROM  gentle cervical PROM/stretch in all directions    Manual Traction  gentle cervical distraction 3 x 20"      Neck Exercises: Stretches   Upper Trapezius Stretch  Right;Left;30 seconds;1 rep    Upper Trapezius Stretch Limitations  seated with hand anchored on edge of seat    Corner Stretch  30 seconds;1 rep each    Corner Stretch Limitations  3 way doorway stretch             PT Education - 05/10/18 1155    Education provided  Yes    Education Details  Cervical eval findings, anticipated POC & initial HEP and postural education    Person(s) Educated  Patient    Methods  Explanation;Demonstration;Handout    Comprehension  Verbalized understanding;Returned  demonstration;Need further instruction       PT Short Term Goals - 04/25/18 1438      PT SHORT TERM GOAL #1   Title  Independent w/ initial HEP     Status  Achieved        PT Long Term Goals - 05/10/18 1202      PT LONG TERM GOAL #1   Title  Independent w/ ongoing HEP    Status  Partially Met      PT LONG TERM GOAL #2   Title  Pt. will demonstrate L hip strength >/= 4+/5 to improve proximal stability     Status  Partially Met      PT LONG TERM GOAL #3   Title  Pt. will report ability to sit for >/= to 30 minutes w/o low back pain    Status  Achieved      PT LONG TERM GOAL #4   Title  Pt will verbalize/demonstrate understanding of neutral spine posture & proper body mechanics to minimize neck & low back strain    Status  New    Target Date  06/21/18      PT LONG TERM GOAL #5   Title  Pt will demonstrate cervical ROM WFL w/o increased pain to allow him to check blind-spot while driving    Status  New    Target Date  06/21/18            Plan - 05/10/18 1205    Clinical Impression Statement  [name] reporting he feels like his back is improving and he would like to proceed with the eval for the cervical radiculopathy at C6 ordered by Dr. Saintclair Halsted on 05/03/18. Cervical spine assessment reveals forward head and rounded shoulder posture with increased muscle tension and mild ttp over cervical paraspinals, UT and pecs (L>R) with mildy decreasewd cervical ROM in extension, B side-bending and B rotation L>R). Pt reports neck pain is intermittent, typically only with neck rotation to L which limits activities such as turning head to check blind spot while driving. Pt demonstrates good potential to benefit from skilled PT to address above deficits and restore functional cervical ROM w/o pain, therefore existing low back POC extended by 4 weeks to incorporate cervical focused interventions while completing remaining low back epsiode.    PT Treatment/Interventions  Dry needling;ADLs/Self Care Home Management;Cryotherapy;Iontophoresis 74m/ml Dexamethasone;Moist Heat;Ultrasound;Therapeutic exercise;Therapeutic activities;Neuromuscular  re-education;Patient/family education;Manual techniques;Passive range of motion;Taping;Electrical Stimulation;Traction    PT Next Visit Plan  Issue electrode placement handout for TENS unit     Consulted and Agree with Plan of Care  Patient       Patient will benefit from skilled therapeutic intervention in order to improve the following deficits and impairments:  Pain, Increased muscle spasms, Decreased activity tolerance, Decreased endurance, Decreased range of motion, Decreased strength, Impaired flexibility, Postural dysfunction  Visit Diagnosis: Acute left-sided low back pain without sciatica - Plan: PT plan of care cert/re-cert  Muscle spasm of back - Plan: PT plan of care cert/re-cert  Muscle weakness (generalized) - Plan: PT plan of care cert/re-cert  Radiculopathy, cervical region - Plan: PT plan of care cert/re-cert  Abnormal posture - Plan: PT plan of care cert/re-cert     Problem List There are no active problems to display for this patient.   JPercival Spanish PT, MPT 05/10/2018, 12:57 PM  [facility] [address]Phone: [phone]  Fax:  [phone]  Name: LYNWOOD KUBISIAK MRN: [id number] Date of Birth: 16-Jan-1961

## 2018-05-12 ENCOUNTER — Ambulatory Visit: Payer: 59

## 2018-05-12 DIAGNOSIS — M545 Low back pain, unspecified: Secondary | ICD-10-CM

## 2018-05-12 DIAGNOSIS — M5412 Radiculopathy, cervical region: Secondary | ICD-10-CM

## 2018-05-12 DIAGNOSIS — M6281 Muscle weakness (generalized): Secondary | ICD-10-CM

## 2018-05-12 DIAGNOSIS — R293 Abnormal posture: Secondary | ICD-10-CM

## 2018-05-12 DIAGNOSIS — M62830 Muscle spasm of back: Secondary | ICD-10-CM

## 2018-05-12 NOTE — Patient Instructions (Signed)
TENS stands for Transcutaneous Electrical Nerve Stimulation. In other words, electrical impulses are allowed to pass through the skin in order to excite a nerve.  ° °Purpose and Use of TENS:  °TENS is a method used to manage acute and chronic pain without the use of drugs. It has been effective in managing pain associated with surgery, sprains, strains, trauma, rheumatoid arthritis, and neuralgias. It is a non-addictive, low risk, and non-invasive technique used to control pain. It is not, by any means, a curative form of treatment.  ° °How TENS Works:  °Most TENS units are a small pocket-sized unit powered by one 9 volt battery. Attached to the outside of the unit are two lead wires where two pins and/or snaps connect on each wire. All units come with a set of four reusable pads or electrodes. These are placed on the skin surrounding the area involved. By inserting the leads into  the pads, the electricity can pass from the unit making the circuit complete.  °As the intensity is turned up slowly, the electrical current enters the body from the electrodes through the skin to the surrounding nerve fibers. This triggers the release of hormones from within the body. These hormones contain pain relievers. By increasing the circulation of these hormones, the person’s pain may be lessened. It is also believed that the electrical stimulation itself helps to block the pain messages being sent to the brain, thus also decreasing the body’s perception of pain.  ° °Hazards:  °TENS units are NOT to be used by patients with PACEMAKERS, DEFIBRILLATORS, DIABETIC PUMPS, PREGNANT WOMEN, and patients with SEIZURE DISORDERS.  °TENS units are NOT to be used over the heart, throat, brain, or spinal cord.  °One of the major side effects from the TENS unit may be skin irritation. Some people may develop a rash if they are sensitive to the materials used in the electrodes or the connecting wires.  ° °Wear the unit for 15 min.  ° °Avoid  overuse due the body getting used to the stem making it not as effective over time.  ° °

## 2018-05-12 NOTE — Therapy (Signed)
[facility] [address]  [place], [place], [address] Phone: [phone]   Fax:  [phone]  Physical Therapy Treatment  Patient Details  Name: Andrew Blevins MRN: [id number] Date of Birth: 07-May-1961 Referring Provider: Dominica Severin P. Saintclair Halsted, MD   Encounter Date: 05/12/2018  PT End of Session - 05/12/18 1411    Visit Number  10    Number of Visits  20    Date for PT Re-Evaluation  06/21/18    Authorization Type  [employer]    PT Start Time  1401    PT Stop Time  1455 ended with 10 min moist heat     PT Time Calculation (min)  54 min    Activity Tolerance  Patient tolerated treatment well    Behavior During Therapy  WFL for tasks assessed/performed       Past Medical History:  Diagnosis Date  . ED (erectile dysfunction)   . GERD (gastroesophageal reflux disease)   . History of Helicobacter pylori infection 02/2005  . Hypertension     Past Surgical History:  Procedure Laterality Date  . INGUINAL HERNIA REPAIR Left 1997  . SHOULDER SURGERY      There were no vitals filed for this visit.  Subjective Assessment - 05/12/18 1407    Subjective  Pt. noting he has been, "doing neck exercises".      Diagnostic tests  Cervical MRI 04/19/18: 1. 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 Notable right C4-5 facet arthritis with moderate marrow edema and mild anterolisthesis; 3. C5-6 spinal stenosis with ventral cord flattening. Right more than left high-grade foraminal impingement; 4. Moderate foraminal narrowing seen on the left at C3-4, bilaterally at C4-5, and left at C6-7.    Patient Stated Goals  Strength back in L Leg; Relief of pain in Low Back    Currently in Pain?  Yes    Pain Score  4     Pain Location  Back    Pain Orientation  Left;Mid    Pain Descriptors / Indicators  -- "stiffness"    Pain Type  Acute pain;Surgical pain    Pain Onset  More than a month ago    Pain Frequency   Intermittent    Aggravating Factors   Bending down     Multiple Pain Sites  Yes    Pain Score  4    Pain Location  Neck    Pain Orientation  Left    Pain Descriptors / Indicators  -- "Stiffness"    Pain Type  Acute pain    Pain Onset  More than a month ago    Pain Frequency  Intermittent    Aggravating Factors   turning head to L          [facility] PT Assessment - 05/12/18 1750      Strength   Right/Left Hip  Right;Left    Right Hip Flexion  4+/5    Right Hip Extension  4/5    Right Hip External Rotation   4+/5    Right Hip Internal Rotation  4+/5    Right Hip ABduction  4+/5    Right Hip ADduction  4+/5    Left Hip Flexion  4/5    Left Hip Extension  4/5    Left Hip External Rotation  4/5    Left Hip Internal Rotation  4/5    Left Hip ABduction  4+/5  Left Hip ADduction  4+/5    Right/Left Knee  Right;Left    Right Knee Flexion  5/5    Right Knee Extension  5/5    Left Knee Flexion  4/5    Left Knee Extension  5/5                   [facility] Adult PT Treatment/Exercise - 05/12/18 1413      Neck Exercises: Theraband   Shoulder External Rotation  10 reps;Red    Shoulder External Rotation Limitations  Hooklying on pool noodle     Horizontal ABduction  10 reps;Red    Horizontal ABduction Limitations  Hooklying on pool noodle    Other Theraband Exercises  Alternating shoulder flex/ext. with red TB Hooklying on pool noodle x 10 reps each way       Neck Exercises: Seated   Neck Retraction  10 reps;5 secs required some cueing for proper motion       Neck Exercises: Supine   Neck Retraction  10 reps;5 secs    Neck Retraction Limitations  into thin pillow      Lumbar Exercises: Aerobic   Nustep  Lvl 7, 6 min (UE/LE)      Lumbar Exercises: Supine   Isometric Hip Flexion  5 seconds x 12     Isometric Hip Flexion Limitations  LE resting on peanut p-ball      Moist Heat Therapy   Number Minutes Moist Heat  10 Minutes    Moist Heat Location  Lumbar Spine       Neck Exercises: Stretches   Upper Trapezius Stretch  Right;Left;30 seconds;1 rep    Upper Trapezius Stretch Limitations  seated with hand anchored on edge of seat    Corner Stretch  30 seconds;1 rep at Production assistant, radio Limitations  3 way doorway stretch             PT Education - 05/12/18 1424    Education provided  Yes    Education Details  TENS unit electrode placement handout and general info     Person(s) Educated  Patient    Methods  Explanation;Verbal cues;Handout    Comprehension  Verbalized understanding;Verbal cues required;Need further instruction       PT Short Term Goals - 04/25/18 1438      PT SHORT TERM GOAL #1   Title  Independent w/ initial HEP    Status  Achieved        PT Long Term Goals - 05/12/18 1439      PT LONG TERM GOAL #1   Title  Independent w/ ongoing HEP    Status  Partially Met      PT LONG TERM GOAL #2   Title  Pt. will demonstrate L hip strength >/= 4+/5 to improve proximal stability     Status  Partially Met      PT LONG TERM GOAL #3   Title  Pt. will report ability to sit for >/= to 30 minutes w/o low back pain    Status  Achieved      PT LONG TERM GOAL #4   Title  Pt will verbalize/demonstrate understanding of neutral spine posture & proper body mechanics to minimize neck & low back strain    Status  On-going      PT LONG TERM GOAL #5   Title  Pt will demonstrate cervical ROM WFL w/o increased pain to allow him to check blind-spot while driving  Status  On-going            Plan - 05/12/18 1415    Clinical Impression Statement  [name] reporting he's been performing neck HEP without issue.  Did require cueing for proper technique with chin tuck activity today.  Treatment focused on strengthening and ROM activities for lumbar and cervical spine with pt. tolerating well.  Pt. requesting to end session with moist heat to lumbar spine.  Progressing well toward goals.      PT Treatment/Interventions  Dry  needling;ADLs/Self Care Home Management;Cryotherapy;Iontophoresis 7m/ml Dexamethasone;Moist Heat;Ultrasound;Therapeutic exercise;Therapeutic activities;Neuromuscular re-education;Patient/family education;Manual techniques;Passive range of motion;Taping;Electrical Stimulation;Traction    Consulted and Agree with Plan of Care  Patient       Patient will benefit from skilled therapeutic intervention in order to improve the following deficits and impairments:  Pain, Increased muscle spasms, Decreased activity tolerance, Decreased endurance, Decreased range of motion, Decreased strength, Impaired flexibility, Postural dysfunction  Visit Diagnosis: Acute left-sided low back pain without sciatica  Muscle spasm of back  Muscle weakness (generalized)  Radiculopathy, cervical region  Abnormal posture     Problem List There are no active problems to display for this patient.   MBess Harvest PTA 05/12/18 5:53 PM  [facility] [address] [place] [address]Phone: [phone]  Fax:  [phone] Name: PAMBROSIO REUTERMRN: [id number] of Birth: 319-Dec-1962

## 2018-05-17 ENCOUNTER — Encounter: Payer: Self-pay | Admitting: Physical Therapy

## 2018-05-17 ENCOUNTER — Ambulatory Visit: Payer: 59 | Attending: Neurosurgery | Admitting: Physical Therapy

## 2018-05-17 DIAGNOSIS — M545 Low back pain, unspecified: Secondary | ICD-10-CM

## 2018-05-17 DIAGNOSIS — R262 Difficulty in walking, not elsewhere classified: Secondary | ICD-10-CM | POA: Insufficient documentation

## 2018-05-17 DIAGNOSIS — M6281 Muscle weakness (generalized): Secondary | ICD-10-CM

## 2018-05-17 DIAGNOSIS — R293 Abnormal posture: Secondary | ICD-10-CM | POA: Diagnosis present

## 2018-05-17 DIAGNOSIS — M25552 Pain in left hip: Secondary | ICD-10-CM | POA: Insufficient documentation

## 2018-05-17 DIAGNOSIS — M5412 Radiculopathy, cervical region: Secondary | ICD-10-CM | POA: Insufficient documentation

## 2018-05-17 DIAGNOSIS — M62830 Muscle spasm of back: Secondary | ICD-10-CM

## 2018-05-17 NOTE — Therapy (Signed)
[facility] [address]  [place], [place], [address] Phone: [phone]   Fax:  [phone]  Physical Therapy Treatment  Patient Details  Name: Andrew Blevins MRN: [id number] Date of Birth: December 18, 1960 Referring Provider: Dominica Severin P. Saintclair Halsted, MD   Encounter Date: 05/17/2018  PT End of Session - 05/17/18 1312    Visit Number  11    Number of Visits  20    Date for PT Re-Evaluation  06/21/18    Authorization Type  [employer]    PT Start Time  1312    PT Stop Time  1410    PT Time Calculation (min)  58 min    Activity Tolerance  Patient tolerated treatment well    Behavior During Therapy  WFL for tasks assessed/performed       Past Medical History:  Diagnosis Date  . ED (erectile dysfunction)   . GERD (gastroesophageal reflux disease)   . History of Helicobacter pylori infection 02/2005  . Hypertension     Past Surgical History:  Procedure Laterality Date  . INGUINAL HERNIA REPAIR Left 1997  . SHOULDER SURGERY      There were no vitals filed for this visit.  Subjective Assessment - 05/17/18 1314    Subjective  Pt reporting "only a little pain" today with pain "improving" but minimal change in pain ratings.    Diagnostic tests  Cervical MRI 04/19/18: 1. 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 Notable right C4-5 facet arthritis with moderate marrow edema and mild anterolisthesis; 3. C5-6 spinal stenosis with ventral cord flattening. Right more than left high-grade foraminal impingement; 4. Moderate foraminal narrowing seen on the left at C3-4, bilaterally at C4-5, and left at C6-7.    Patient Stated Goals  Strength back in L Leg; Relief of pain in Low Back    Currently in Pain?  Yes    Pain Score  4     Pain Location  Back    Pain Orientation  Left;Mid    Pain Descriptors / Indicators  Throbbing    Pain Type  Acute pain;Surgical pain    Pain Radiating Towards  L groin    Pain Frequency   Intermittent    Pain Score  3    Pain Location  Neck    Pain Orientation  Left    Pain Descriptors / Indicators  -- "stiffness"    Pain Type  Acute pain    Pain Frequency  Intermittent                       [facility] Adult PT Treatment/Exercise - 05/17/18 1312      Exercises   Exercises  Lumbar      Neck Exercises: Prone   Other Prone Exercise  I's, T's & Y's in plank position over green Pball x10 each      Lumbar Exercises: Stretches   Hip Flexor Stretch  Left;30 seconds;2 reps    Hip Flexor Stretch Limitations  1/2 kneel lunge with strap for quad stretch    Quad Stretch  Left;30 seconds;2 reps    Quad Stretch Limitations  prone with strap + with hip extended on bolster     Other Lumbar Stretch Exercise  B hip adductor butterfly stretch x30"      Lumbar Exercises: Standing   Forward Lunge  10 reps;3 seconds    Forward Lunge Limitations  bilateral - single UE support  on counter      Lumbar Exercises: Supine   Dead Bug  10 reps;3 seconds    Dead Bug Limitations  from hooklying    Other Supine Lumbar Exercises  Abd bracing + alt hip flexion with red TB from heels resting on peanut ball 15 x 3"      Lumbar Exercises: Quadruped   Single Arm Raise  Right;Left;10 reps;3 seconds    Straight Leg Raise  10 reps;3 seconds      Modalities   Modalities  Moist Heat      Moist Heat Therapy   Number Minutes Moist Heat  10 Minutes    Moist Heat Location  Cervical;Lumbar Spine      Manual Therapy   Manual Therapy  Soft tissue mobilization;Myofascial release    Manual therapy comments  hooklying with slight L hip ER    Soft tissue mobilization  L hip flexors & proximal quads    Myofascial Release  manual TPR to L iliopsoas & prox RF             PT Education - 05/17/18 1400    Education provided  Yes    Education Details  HEP update - hip flexor & proximal quad/RF stretches    Person(s) Educated  Patient    Methods  Explanation;Demonstration;Handout     Comprehension  Verbalized understanding;Returned demonstration       PT Short Term Goals - 04/25/18 1438      PT SHORT TERM GOAL #1   Title  Independent w/ initial HEP    Status  Achieved        PT Long Term Goals - 05/12/18 1439      PT LONG TERM GOAL #1   Title  Independent w/ ongoing HEP    Status  Partially Met      PT LONG TERM GOAL #2   Title  Pt. will demonstrate L hip strength >/= 4+/5 to improve proximal stability     Status  Partially Met      PT LONG TERM GOAL #3   Title  Pt. will report ability to sit for >/= to 30 minutes w/o low back pain    Status  Achieved      PT LONG TERM GOAL #4   Title  Pt will verbalize/demonstrate understanding of neutral spine posture & proper body mechanics to minimize neck & low back strain    Status  On-going      PT LONG TERM GOAL #5   Title  Pt will demonstrate cervical ROM WFL w/o increased pain to allow him to check blind-spot while driving    Status  On-going            Plan - 05/17/18 1318    Clinical Impression Statement  Andrew Blevins noting new pain into L groin today with increased muscle tension and taut bands noted in L iliopsoas & rectus femoris - targeted with manual therapy STM/MFR and stretches with pt noting significant improvement in pain follwing this. HEP updated to include hip flexor/RF stretches for home. Continued emphasis on lumbopelvic strengthening while incorporating upper back and scapular strengthening/stabilization to address neck pain & sitffness with good tolerance.    PT Treatment/Interventions  Dry needling;ADLs/Self Care Home Management;Cryotherapy;Iontophoresis 69m/ml Dexamethasone;Moist Heat;Ultrasound;Therapeutic exercise;Therapeutic activities;Neuromuscular re-education;Patient/family education;Manual techniques;Passive range of motion;Taping;Electrical Stimulation;Traction    Consulted and Agree with Plan of Care  Patient       Patient will benefit from skilled therapeutic intervention in order  to improve  the following deficits and impairments:  Pain, Increased muscle spasms, Decreased activity tolerance, Decreased endurance, Decreased range of motion, Decreased strength, Impaired flexibility, Postural dysfunction  Visit Diagnosis: Acute left-sided low back pain without sciatica  Muscle spasm of back  Muscle weakness (generalized)  Radiculopathy, cervical region  Abnormal posture     Problem List There are no active problems to display for this patient.   Percival Spanish, PT, MPT 05/17/2018, 2:33 PM  [facility] [address]  [place], [place], [address] Phone: [phone]   Fax:  [phone]  Name: Andrew Blevins MRN: [id number] Date of Birth: Jan 18, 1961

## 2018-05-18 DIAGNOSIS — H25013 Cortical age-related cataract, bilateral: Secondary | ICD-10-CM | POA: Diagnosis not present

## 2018-05-18 DIAGNOSIS — H40013 Open angle with borderline findings, low risk, bilateral: Secondary | ICD-10-CM | POA: Diagnosis not present

## 2018-05-18 DIAGNOSIS — H2513 Age-related nuclear cataract, bilateral: Secondary | ICD-10-CM | POA: Diagnosis not present

## 2018-05-19 ENCOUNTER — Ambulatory Visit: Payer: 59

## 2018-05-19 ENCOUNTER — Encounter

## 2018-05-19 DIAGNOSIS — I10 Essential (primary) hypertension: Secondary | ICD-10-CM | POA: Diagnosis not present

## 2018-05-19 DIAGNOSIS — M62830 Muscle spasm of back: Secondary | ICD-10-CM

## 2018-05-19 DIAGNOSIS — M545 Low back pain, unspecified: Secondary | ICD-10-CM

## 2018-05-19 DIAGNOSIS — N182 Chronic kidney disease, stage 2 (mild): Secondary | ICD-10-CM | POA: Diagnosis not present

## 2018-05-19 DIAGNOSIS — E782 Mixed hyperlipidemia: Secondary | ICD-10-CM | POA: Diagnosis not present

## 2018-05-19 DIAGNOSIS — M5412 Radiculopathy, cervical region: Secondary | ICD-10-CM

## 2018-05-19 DIAGNOSIS — M6281 Muscle weakness (generalized): Secondary | ICD-10-CM

## 2018-05-19 DIAGNOSIS — Z23 Encounter for immunization: Secondary | ICD-10-CM | POA: Diagnosis not present

## 2018-05-19 DIAGNOSIS — R293 Abnormal posture: Secondary | ICD-10-CM

## 2018-05-19 NOTE — Therapy (Signed)
[facility] [address]  [place], [place], [address] Phone: [phone]   Fax:  [phone]  Physical Therapy Treatment  Patient Details  Name: Andrew Blevins MRN: [id number] Date of Birth: 28-Dec-1960 Referring Provider: Dominica Severin P. Saintclair Halsted, MD   Encounter Date: 05/19/2018  PT End of Session - 05/19/18 0856    Visit Number  12    Number of Visits  20    Date for PT Re-Evaluation  06/21/18    Authorization Type  [place] Start Time  [phone]    PT Stop Time  0940 ended session wiht 10 min moist heat    PT Time Calculation (min)  53 min    Activity Tolerance  Patient tolerated treatment well    Behavior During Therapy  WFL for tasks assessed/performed       Past Medical History:  Diagnosis Date  . ED (erectile dysfunction)   . GERD (gastroesophageal reflux disease)   . History of Helicobacter pylori infection 02/2005  . Hypertension     Past Surgical History:  Procedure Laterality Date  . INGUINAL HERNIA REPAIR Left 1997  . SHOULDER SURGERY      There were no vitals filed for this visit.  Subjective Assessment - 05/19/18 0853    Subjective  Pt. noting some improvement in anterior hip "tightness" after last visit.      Diagnostic tests  Cervical MRI 04/19/18: 1. 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 Notable right C4-5 facet arthritis with moderate marrow edema and mild anterolisthesis; 3. C5-6 spinal stenosis with ventral cord flattening. Right more than left high-grade foraminal impingement; 4. Moderate foraminal narrowing seen on the left at C3-4, bilaterally at C4-5, and left at C6-7.    Patient Stated Goals  Strength back in L Leg; Relief of pain in Low Back    Currently in Pain?  Yes    Pain Score  4     Pain Location  Back    Pain Orientation  Left;Mid    Pain Descriptors / Indicators  Throbbing    Pain Type  Acute pain;Surgical pain    Pain Radiating Towards  L groin      Pain Onset  More than a month ago    Pain Frequency  Intermittent    Aggravating Factors   Bending down    Multiple Pain Sites  No    Pain Score  3    Pain Location  Neck    Pain Orientation  Left    Pain Descriptors / Indicators  Aching    Pain Type  Acute pain    Pain Onset  More than a month ago    Pain Frequency  Intermittent    Aggravating Factors   Turning head to L     Pain Relieving Factors  keeping head straight                        [facility] Adult PT Treatment/Exercise - 05/19/18 0907      Neck Exercises: Theraband   Shoulder Extension  10 reps;Red    Rows  15 reps;Red    Shoulder External Rotation  15 reps;Red Cues for proper technique with chin tuck     Shoulder External Rotation Limitations  seated + chin tuck       Lumbar Exercises: Stretches   Single Knee to Chest Stretch  Left;Right;1 rep;20 seconds  Hip Flexor Stretch  Left;30 seconds;2 reps Some cueing required for proper positioning    Hip Flexor Stretch Limitations  1/2 kneel lunge with strap for quad stretch knee on chair     Quad Stretch  Left;30 seconds;2 reps Some cueing required for proper positioning    Quad Stretch Limitations  prone with strap + with hip extended on bolster       Lumbar Exercises: Aerobic   Recumbent Bike  L3 x 6'       Lumbar Exercises: Supine   Isometric Hip Flexion  10 reps;3 seconds    Isometric Hip Flexion Limitations  L LE + abdom. brace       Moist Heat Therapy   Number Minutes Moist Heat  10 Minutes    Moist Heat Location  Cervical;Lumbar Spine      Manual Therapy   Manual Therapy  Soft tissue mobilization;Myofascial release    Manual therapy comments  Hooklying, mod thomas position    Soft tissue mobilization  L hip flexors; pt. noting some tenderness/tightness however no palpated TP               PT Short Term Goals - 04/25/18 1438      PT SHORT TERM GOAL #1   Title  Independent w/ initial HEP    Status  Achieved        PT Long  Term Goals - 05/12/18 1439      PT LONG TERM GOAL #1   Title  Independent w/ ongoing HEP    Status  Partially Met      PT LONG TERM GOAL #2   Title  Pt. will demonstrate L hip strength >/= 4+/5 to improve proximal stability     Status  Partially Met      PT LONG TERM GOAL #3   Title  Pt. will report ability to sit for >/= to 30 minutes w/o low back pain    Status  Achieved      PT LONG TERM GOAL #4   Title  Pt will verbalize/demonstrate understanding of neutral spine posture & proper body mechanics to minimize neck & low back strain    Status  On-going      PT LONG TERM GOAL #5   Title  Pt will demonstrate cervical ROM WFL w/o increased pain to allow him to check blind-spot while driving    Status  On-going            Plan - 05/19/18 0910    Clinical Impression Statement  Pt. primary complaint today to start session was "tightness" in L anterior hip.  Some improvement in this area following STM/LE stretching today.  Remainder of visit focused on gentle scapular strengthening with Thera band resistance, which was tolerated well.  Pt. still noting neck pain worst with L rotation.  Ended session with moist heat applied to lumbar spine, L anterior thigh, and cervical area per pt. request.  [name] leaving session today noting improvement in overall pain levels.  Will continue to progress toward goals per pt. tolerance in coming visits.    PT Treatment/Interventions  Dry needling;ADLs/Self Care Home Management;Cryotherapy;Iontophoresis 80m/ml Dexamethasone;Moist Heat;Ultrasound;Therapeutic exercise;Therapeutic activities;Neuromuscular re-education;Patient/family education;Manual techniques;Passive range of motion;Taping;Electrical Stimulation;Traction    Consulted and Agree with Plan of Care  Patient       Patient will benefit from skilled therapeutic intervention in order to improve the following deficits and impairments:  Pain, Increased muscle spasms, Decreased activity tolerance,  Decreased endurance, Decreased range of  motion, Decreased strength, Impaired flexibility, Postural dysfunction  Visit Diagnosis: Acute left-sided low back pain without sciatica  Muscle spasm of back  Muscle weakness (generalized)  Radiculopathy, cervical region  Abnormal posture     Problem List There are no active problems to display for this patient.   Bess Harvest, PTA 05/19/18 12:41 PM    [facility] [address]  [place], [place], [address] Phone: [phone]   Fax:  [phone]  Name: Andrew Blevins MRN: [id number] Date of Birth: 1961/08/25

## 2018-05-24 ENCOUNTER — Encounter: Payer: Self-pay | Admitting: Physical Therapy

## 2018-05-24 ENCOUNTER — Ambulatory Visit: Payer: 59 | Admitting: Physical Therapy

## 2018-05-24 DIAGNOSIS — M62830 Muscle spasm of back: Secondary | ICD-10-CM

## 2018-05-24 DIAGNOSIS — M545 Low back pain, unspecified: Secondary | ICD-10-CM

## 2018-05-24 DIAGNOSIS — M5412 Radiculopathy, cervical region: Secondary | ICD-10-CM

## 2018-05-24 DIAGNOSIS — R293 Abnormal posture: Secondary | ICD-10-CM

## 2018-05-24 DIAGNOSIS — M6281 Muscle weakness (generalized): Secondary | ICD-10-CM

## 2018-05-24 NOTE — Therapy (Signed)
[facility] [address]  [place], [place], [address] Phone: [phone]   Fax:  [phone]  Physical Therapy Treatment  Patient Details  Name: Andrew Blevins MRN: [id number] Date of Birth: [birth year] Referring Provider: Dominica Severin P. Saintclair Halsted, MD   Encounter Date: 05/24/2018  PT End of Session - 05/24/18 1316    Visit Number  13    Number of Visits  20    Date for PT Re-Evaluation  06/21/18    Authorization Type  [employer]    PT Start Time  1316    PT Stop Time  1415    PT Time Calculation (min)  59 min    Activity Tolerance  Patient tolerated treatment well    Behavior During Therapy  WFL for tasks assessed/performed       Past Medical History:  Diagnosis Date  . ED (erectile dysfunction)   . GERD (gastroesophageal reflux disease)   . History of Helicobacter pylori infection 02/2005  . Hypertension     Past Surgical History:  Procedure Laterality Date  . INGUINAL HERNIA REPAIR Left 1997  . SHOULDER SURGERY      There were no vitals filed for this visit.  Subjective Assessment - 05/24/18 1319    Subjective  Pt still noting a "catch" in his L groin with initiation of movement.    Diagnostic tests  Cervical MRI 04/19/18: 1. 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 Notable right C4-5 facet arthritis with moderate marrow edema and mild anterolisthesis; 3. C5-6 spinal stenosis with ventral cord flattening. Right more than left high-grade foraminal impingement; 4. Moderate foraminal narrowing seen on the left at C3-4, bilaterally at C4-5, and left at C6-7.    Patient Stated Goals  Strength back in L Leg; Relief of pain in Low Back    Currently in Pain?  Yes    Pain Score  6     Pain Location  Groin    Pain Orientation  Left    Pain Descriptors / Indicators  Tightness    Pain Score  3    Pain Location  Back & neck    Pain Orientation  Left;Mid    Pain Descriptors / Indicators  Aching     Pain Onset  --                       [facility] Adult PT Treatment/Exercise - 05/24/18 1316      Exercises   Exercises  Lumbar      Lumbar Exercises: Stretches   Hip Flexor Stretch  Left;30 seconds;2 reps    Hip Flexor Stretch Limitations  1/2 kneel lunge on floor  & prone with strap (hip extended)      Lumbar Exercises: Aerobic   Recumbent Bike  L3 x 6'       Lumbar Exercises: Standing   Row  Both;15 reps;Strengthening    Row Limitations  TRX      Knee/Hip Exercises: Standing   Hip Flexion  Both;15 reps;Knee straight;Stengthening    Hip Flexion Limitations  red TB, 1 pole A    Hip ADduction  Both;15 reps;Strengthening    Hip ADduction Limitations  red TB, 1 pole A    Hip Abduction  Both;15 reps;Knee straight;Stengthening    Abduction Limitations  red TB, 1 pole A    Hip Extension  Both;15 reps;Knee straight;Stengthening    Extension Limitations  red TB, 1 pole  A      Modalities   Modalities  Moist Heat      Moist Heat Therapy   Number Minutes Moist Heat  10 Minutes    Moist Heat Location  Cervical;Lumbar Spine;Hip      Manual Therapy   Manual Therapy  Soft tissue mobilization;Myofascial release    Manual therapy comments  hooklying with slight L hip ER    Soft tissue mobilization  L hip flexors & proximal quads    Myofascial Release  manual TPR to L iliopsoas & prox RF               PT Short Term Goals - 04/25/18 1438      PT SHORT TERM GOAL #1   Title  Independent w/ initial HEP    Status  Achieved        PT Long Term Goals - 05/12/18 1439      PT LONG TERM GOAL #1   Title  Independent w/ ongoing HEP    Status  Partially Met      PT LONG TERM GOAL #2   Title  Pt. will demonstrate L hip strength >/= 4+/5 to improve proximal stability     Status  Partially Met      PT LONG TERM GOAL #3   Title  Pt. will report ability to sit for >/= to 30 minutes w/o low back pain    Status  Achieved      PT LONG TERM GOAL #4   Title  Pt will  verbalize/demonstrate understanding of neutral spine posture & proper body mechanics to minimize neck & low back strain    Status  On-going      PT LONG TERM GOAL #5   Title  Pt will demonstrate cervical ROM WFL w/o increased pain to allow him to check blind-spot while driving    Status  On-going            Plan - 05/24/18 1323    Clinical Impression Statement  Andrew Blevins continuing to c/o L anterior hip/groin pain & tightness with limp noted on initiation of movement/stepping - continued increased muscle tension evident in L iliopsoas muscle as well as proximal RF which responeded well to manual therapy followed by stretches and hip strengthening exercises with core activation - pt noting reduction in pain & tightness afterward. limited focus on neck today given increased hip/groin pain, but noting that neck pain has been imporving.    Rehab Potential  Good    PT Treatment/Interventions  Dry needling;ADLs/Self Care Home Management;Cryotherapy;Iontophoresis 61m/ml Dexamethasone;Moist Heat;Ultrasound;Therapeutic exercise;Therapeutic activities;Neuromuscular re-education;Patient/family education;Manual techniques;Passive range of motion;Taping;Electrical Stimulation;Traction    Consulted and Agree with Plan of Care  Patient       Patient will benefit from skilled therapeutic intervention in order to improve the following deficits and impairments:  Pain, Increased muscle spasms, Decreased activity tolerance, Decreased endurance, Decreased range of motion, Decreased strength, Impaired flexibility, Postural dysfunction  Visit Diagnosis: Acute left-sided low back pain without sciatica  Muscle spasm of back  Muscle weakness (generalized)  Radiculopathy, cervical region  Abnormal posture     Problem List There are no active problems to display for this patient.   JPercival Blevins PT, MPT 05/24/2018, 2:09 PM  [facility] [place] [address]Phone: [phone]  Fax:  [phone] Name: Andrew MCLESTERMRN: [id number] of Birth: 304-03-1961

## 2018-05-26 ENCOUNTER — Ambulatory Visit: Payer: 59

## 2018-05-26 DIAGNOSIS — R293 Abnormal posture: Secondary | ICD-10-CM

## 2018-05-26 DIAGNOSIS — M5412 Radiculopathy, cervical region: Secondary | ICD-10-CM

## 2018-05-26 DIAGNOSIS — M545 Low back pain, unspecified: Secondary | ICD-10-CM

## 2018-05-26 DIAGNOSIS — M6281 Muscle weakness (generalized): Secondary | ICD-10-CM

## 2018-05-26 DIAGNOSIS — M62830 Muscle spasm of back: Secondary | ICD-10-CM

## 2018-05-26 NOTE — Therapy (Signed)
[facility] [address]  [place], [place], [address] Phone: [phone]   Fax:  [phone]  Physical Therapy Treatment  Patient Details  Name: Andrew Blevins MRN: [id number] Date of Birth: 28-Apr-1961 Referring Provider: Dominica Severin P. Saintclair Halsted, MD   Encounter Date: 05/26/2018  PT End of Session - 05/26/18 1320    Visit Number  14    Number of Visits  20    Date for PT Re-Evaluation  06/21/18    Authorization Type  [employer]    PT Start Time  1318    PT Stop Time  1420    PT Time Calculation (min)  62 min    Activity Tolerance  Patient tolerated treatment well    Behavior During Therapy  WFL for tasks assessed/performed       Past Medical History:  Diagnosis Date  . ED (erectile dysfunction)   . GERD (gastroesophageal reflux disease)   . History of Helicobacter pylori infection 02/2005  . Hypertension     Past Surgical History:  Procedure Laterality Date  . INGUINAL HERNIA REPAIR Left 1997  . SHOULDER SURGERY      There were no vitals filed for this visit.  Subjective Assessment - 05/26/18 1319    Subjective  Pt. noting "catch" in L groin has improved with stretching     Diagnostic tests  Cervical MRI 04/19/18: 1. 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 Notable right C4-5 facet arthritis with moderate marrow edema and mild anterolisthesis; 3. C5-6 spinal stenosis with ventral cord flattening. Right more than left high-grade foraminal impingement; 4. Moderate foraminal narrowing seen on the left at C3-4, bilaterally at C4-5, and left at C6-7.    Patient Stated Goals  Strength back in L Leg; Relief of pain in Low Back    Currently in Pain?  Yes    Pain Score  5     Pain Location  Groin    Pain Orientation  Left    Pain Descriptors / Indicators  Tightness    Pain Type  Acute pain    Pain Radiating Towards  into L hip flexors     Pain Frequency  Intermittent    Aggravating Factors    unsure     Pain Relieving Factors  heat     Multiple Pain Sites  No                       [facility] Adult PT Treatment/Exercise - 05/26/18 1323      Neck Exercises: Prone   Neck Retraction  10 reps;3 secs      Lumbar Exercises: Stretches   Hip Flexor Stretch  Left;30 seconds;2 reps    Hip Flexor Stretch Limitations  with knee on mat table and UE support on chair       Lumbar Exercises: Aerobic   Recumbent Bike  L3 x 6'       Lumbar Exercises: Standing   Functional Squats  15 reps;5 seconds    Other Standing Lumbar Exercises  Side stepping with red looped TB at forefoot 2 x 30 ft       Lumbar Exercises: Supine   Other Supine Lumbar Exercises  Abdom. bracing + tA with altrenating UE raise overhead in hooklying x 15 reps  tc to maintain proper contraction       Moist Heat Therapy   Number Minutes Moist Heat  20 Minutes  Moist Heat Location  Lumbar Spine;Hip Lumbar spine and anterior L hip       Manual Therapy   Manual Therapy  Soft tissue mobilization;Myofascial release    Manual therapy comments  mod thomas position     Soft tissue mobilization  L hip flexors; L UT     Myofascial Release  manual TPR to L iliopsoas; TPR to L superior glute in R sidelying with L LE elevated on bolster; good response with relief from pain noted     Passive ROM  L UT stretch with therapist overpressure x 30 sec                PT Short Term Goals - 04/25/18 1438      PT SHORT TERM GOAL #1   Title  Independent w/ initial HEP    Status  Achieved        PT Long Term Goals - 05/12/18 1439      PT LONG TERM GOAL #1   Title  Independent w/ ongoing HEP    Status  Partially Met      PT LONG TERM GOAL #2   Title  Pt. will demonstrate L hip strength >/= 4+/5 to improve proximal stability     Status  Partially Met      PT LONG TERM GOAL #3   Title  Pt. will report ability to sit for >/= to 30 minutes w/o low back pain    Status  Achieved      PT LONG TERM GOAL #4    Title  Pt will verbalize/demonstrate understanding of neutral spine posture & proper body mechanics to minimize neck & low back strain    Status  On-going      PT LONG TERM GOAL #5   Title  Pt will demonstrate cervical ROM WFL w/o increased pain to allow him to check blind-spot while driving    Status  On-going            Plan - 05/26/18 1321    Clinical Impression Statement  Andrew Blevins seen to start session noting improvement in L anterior hip pain since last visit and finding relief with HEP stretches.  Pt. primary concern today was L anterior hip, "catching" pain and L LBP/buttock pain.  Pt. responded well to STM/TPR to proximal hip musculature today following by glute and hip flexor stretching.  Duration of session today focused on gentle lumbopelvic strengthening activities and postural strengthening.  Pt. noting improvement in overall pain levels of ~ 70% since starting therapy.  Ended session with moist heat applied to L anterior hip per pt. request to further relax musculature with pt. noting good relief.  Pt. verbalizing that he may be interested in DN in future visits to address lingering "tightness" in proximal hip musculature.  Will plan to discuss this with supervising PT before next visit.     PT Treatment/Interventions  Dry needling;ADLs/Self Care Home Management;Cryotherapy;Iontophoresis 73m/ml Dexamethasone;Moist Heat;Ultrasound;Therapeutic exercise;Therapeutic activities;Neuromuscular re-education;Patient/family education;Manual techniques;Passive range of motion;Taping;Electrical Stimulation;Traction    Consulted and Agree with Plan of Care  Patient       Patient will benefit from skilled therapeutic intervention in order to improve the following deficits and impairments:  Pain, Increased muscle spasms, Decreased activity tolerance, Decreased endurance, Decreased range of motion, Decreased strength, Impaired flexibility, Postural dysfunction  Visit Diagnosis: Acute left-sided  low back pain without sciatica  Muscle spasm of back  Muscle weakness (generalized)  Radiculopathy, cervical region  Abnormal posture  Problem List There are no active problems to display for this patient.   Bess Harvest, PTA 05/26/18 3:08 PM  [facility] [address]  [place], [place], [address] Phone: [phone]   Fax:  [phone]  Name: Andrew Blevins MRN: [id number] Date of Birth: April 10, 1961

## 2018-05-27 DIAGNOSIS — M25552 Pain in left hip: Secondary | ICD-10-CM | POA: Diagnosis not present

## 2018-05-31 ENCOUNTER — Ambulatory Visit: Payer: 59 | Admitting: Physical Therapy

## 2018-05-31 DIAGNOSIS — R262 Difficulty in walking, not elsewhere classified: Secondary | ICD-10-CM

## 2018-05-31 DIAGNOSIS — M62830 Muscle spasm of back: Secondary | ICD-10-CM

## 2018-05-31 DIAGNOSIS — M6281 Muscle weakness (generalized): Secondary | ICD-10-CM

## 2018-05-31 DIAGNOSIS — M5412 Radiculopathy, cervical region: Secondary | ICD-10-CM

## 2018-05-31 DIAGNOSIS — R293 Abnormal posture: Secondary | ICD-10-CM

## 2018-05-31 DIAGNOSIS — M545 Low back pain, unspecified: Secondary | ICD-10-CM

## 2018-05-31 DIAGNOSIS — M25552 Pain in left hip: Secondary | ICD-10-CM

## 2018-05-31 NOTE — Therapy (Addendum)
[facility] [address]  [place], [place], [address] Phone: [phone]   Fax:  [phone]  Physical Therapy Treatment  Patient Details  Name: Andrew Blevins MRN: [id number] Date of Birth: 17-Jul-1961 Referring Provider: Dominica Severin P. Saintclair Halsted, MD   Encounter Date: 05/31/2018  PT End of Session - 05/31/18 1534    Visit Number  15    Number of Visits  20    Date for PT Re-Evaluation  06/21/18    Authorization Type  [employer]    PT Start Time  1102    PT Stop Time  1150    PT Time Calculation (min)  48 min    Activity Tolerance  Patient tolerated treatment well    Behavior During Therapy  WFL for tasks assessed/performed       Past Medical History:  Diagnosis Date  . ED (erectile dysfunction)   . GERD (gastroesophageal reflux disease)   . History of Helicobacter pylori infection 02/2005  . Hypertension     Past Surgical History:  Procedure Laterality Date  . INGUINAL HERNIA REPAIR Left 1997  . SHOULDER SURGERY      There were no vitals filed for this visit.  Subjective Assessment - 05/31/18 1104    Subjective  Pt notes that he is feeling a lot better today but still has some pain in the mid and low back and tightness in the front of the L hip.     Currently in Pain?  Yes    Pain Score  3     Pain Location  Back    Pain Orientation  Left    Pain Descriptors / Indicators  Tightness    Pain Type  Acute pain    Pain Radiating Towards  From the mid to the low back into the L glute; tightness in the front of the hip    Pain Onset  More than a month ago    Pain Frequency  Intermittent    Pain Relieving Factors  Heat    Multiple Pain Sites  No                       [facility] Adult PT Treatment/Exercise - 05/31/18 1105      Lumbar Exercises: Aerobic   UBE (Upper Arm Bike)  L4 x 6'       Lumbar Exercises: Standing   Push / Pull Sled  Push 150 ft; Pull 330 ft  20# in cart    Other Standing  Lumbar Exercises  Side stepping with sled; 60 ft leading with each LE (180 ft total) 10# in sled      Modalities   Modalities  Moist Heat      Moist Heat Therapy   Number Minutes Moist Heat  15 Minutes    Moist Heat Location  Lumbar Spine;Cervical      Manual Therapy   Manual Therapy  Myofascial release    Manual therapy comments  Prone; Mod Thomas position: L leg off of table; Supine for cervical manual    Joint Mobilization  L mid back, L glute musculature, L hip anterior mobs; grade 3; 5 min    Soft tissue mobilization  L hip adductors    Myofascial Release  Manual TPR to adductor brevis, STM to L cervical musculature             PT Education - 05/31/18 1513    Education Details  Pt  educated on dry needling for the hip musculature; stated that is a possibility for him next session.     Person(s) Educated  Patient    Methods  Explanation    Comprehension  Verbalized understanding       PT Short Term Goals - 04/25/18 1438      PT SHORT TERM GOAL #1   Title  Independent w/ initial HEP    Status  Achieved        PT Long Term Goals - 05/31/18 1531      PT LONG TERM GOAL #1   Title  Independent w/ ongoing HEP    Status  Partially Met      PT LONG TERM GOAL #2   Title  Pt. will demonstrate L hip strength >/= 4+/5 to improve proximal stability     Status  Partially Met      PT LONG TERM GOAL #3   Title  Pt. will report ability to sit for >/= to 30 minutes w/o low back pain    Status  Achieved      PT LONG TERM GOAL #4   Title  Pt will verbalize/demonstrate understanding of neutral spine posture & proper body mechanics to minimize neck & low back strain    Status  On-going      PT LONG TERM GOAL #5   Title  Pt will demonstrate cervical ROM WFL w/o increased pain to allow him to check blind-spot while driving    Status  On-going            Plan - 05/31/18 1514    Clinical Impression Statement  Pt did well with treatment session today with no increased  pain throughout session. Primary concern today was addressing the mid back pain with some focus on the L hip and L neck musculature. Palpable trigger point found in L hip adductor musculature that did not respond to manual therapy. Pt continues to experience decreased overall pain and increased tolerance for sitting activities. Pt will continue to benefit from physical therapy to continue to decrease back pain, strengthen the L LE, address muscle trigger points and progress towards functional goals.      Rehab Potential  Good    PT Treatment/Interventions  Dry needling;ADLs/Self Care Home Management;Cryotherapy;Iontophoresis 1m/ml Dexamethasone;Moist Heat;Ultrasound;Therapeutic exercise;Therapeutic activities;Neuromuscular re-education;Patient/family education;Manual techniques;Passive range of motion;Taping;Electrical Stimulation;Traction    PT Next Visit Plan  Pending pt consent - dry needling for hip musculature    Consulted and Agree with Plan of Care  Patient       Patient will benefit from skilled therapeutic intervention in order to improve the following deficits and impairments:  Pain, Increased muscle spasms, Decreased activity tolerance, Decreased endurance, Decreased range of motion, Decreased strength, Impaired flexibility, Postural dysfunction  Visit Diagnosis: Acute left-sided low back pain without sciatica  Muscle spasm of back  Muscle weakness (generalized)  Radiculopathy, cervical region  Abnormal posture  Pain in left hip  Difficulty in walking, not elsewhere classified     Problem List There are no active problems to display for this patient.   JShirline Frees SPT 05/31/2018, 6:32 PM  [facility] [place] [address]Phone: [phone]  Fax:  [phone] Name: Andrew Blevins

## 2018-06-02 ENCOUNTER — Ambulatory Visit: Payer: 59

## 2018-06-02 DIAGNOSIS — M545 Low back pain, unspecified: Secondary | ICD-10-CM

## 2018-06-02 DIAGNOSIS — R293 Abnormal posture: Secondary | ICD-10-CM

## 2018-06-02 DIAGNOSIS — M6281 Muscle weakness (generalized): Secondary | ICD-10-CM

## 2018-06-02 DIAGNOSIS — M62830 Muscle spasm of back: Secondary | ICD-10-CM

## 2018-06-02 DIAGNOSIS — M5412 Radiculopathy, cervical region: Secondary | ICD-10-CM

## 2018-06-02 NOTE — Therapy (Addendum)
[facility] [address]  [place], [place], [address] Phone: [phone]   Fax:  [phone]  Physical Therapy Treatment  Patient Details  Name: DEMARIE HYNEMAN MRN: [id number] Date of Birth: [birth year] Referring Provider: Dominica Severin P. Saintclair Halsted, MD   Encounter Date: 06/02/2018  PT End of Session - 06/02/18 1112    Visit Number  16    Number of Visits  20    Date for PT Re-Evaluation  06/21/18    Authorization Type  [employer]    PT Start Time  1107    PT Stop Time  1207 ended with 10 min moist heat     PT Time Calculation (min)  60 min    Activity Tolerance  Patient tolerated treatment well    Behavior During Therapy  WFL for tasks assessed/performed       Past Medical History:  Diagnosis Date  . ED (erectile dysfunction)   . GERD (gastroesophageal reflux disease)   . History of Helicobacter pylori infection 02/2005  . Hypertension     Past Surgical History:  Procedure Laterality Date  . INGUINAL HERNIA REPAIR Left 1997  . SHOULDER SURGERY      There were no vitals filed for this visit.  Subjective Assessment - 06/02/18 1112    Subjective  Started back to work on Monday.  Has been feeling like work on light duty has been going well without increased neck or back pain.      Diagnostic tests  Cervical MRI 04/19/18: 1. 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 Notable right C4-5 facet arthritis with moderate marrow edema and mild anterolisthesis; 3. C5-6 spinal stenosis with ventral cord flattening. Right more than left high-grade foraminal impingement; 4. Moderate foraminal narrowing seen on the left at C3-4, bilaterally at C4-5, and left at C6-7.    Patient Stated Goals  Strength back in L Leg; Relief of pain in Low Back    Currently in Pain?  Yes    Pain Score  1     Pain Location  Back    Pain Orientation  Left    Pain Descriptors / Indicators  Tightness    Pain Type  Acute pain    Pain Frequency  Intermittent    Aggravating Factors   Prolongede standing     Pain Relieving Factors  heat     Multiple Pain Sites  Yes    Pain Score  3    Pain Location  Neck    Pain Orientation  Left;Mid    Pain Descriptors / Indicators  Aching    Pain Type  Acute pain    Pain Onset  More than a month ago    Pain Frequency  Intermittent    Aggravating Factors   unsure                        [facility] Adult PT Treatment/Exercise - 06/02/18 1128      Neck Exercises: Theraband   Shoulder External Rotation  15 reps;Red 3" hold     Shoulder External Rotation Limitations  standing leaning on doorframe + chin tuck and scap. retraction       Neck Exercises: Prone   Other Prone Exercise  I's, T's & Y's in plank position over red Pball x 15 each      Lumbar Exercises: Stretches   Single Knee to Chest Stretch  Left;Right;1 rep;20 seconds due  to increased back pain - noted relief following this     Hip Flexor Stretch  Left;30 seconds;2 reps    Hip Flexor Stretch Limitations  1/2 kneeling position with knee on chair     Other Lumbar Stretch Exercise  B hip adductor butterfly stretch x30" added to HEP       Lumbar Exercises: Aerobic   Recumbent Bike  L3 x 6'       Lumbar Exercises: Machines for Strengthening   Other Lumbar Machine Exercise  BATCA low row 15# 5" x 15 reps       Lumbar Exercises: Standing   Functional Squats  15 reps;5 seconds    Forward Lunge  3 seconds;15 reps    Forward Lunge Limitations  TRX - mini     Other Standing Lumbar Exercises  B pallof press with red TB closed in door x 10 reps each way       Modalities   Modalities  Cryotherapy      Moist Heat Therapy   Number Minutes Moist Heat  10 Minutes    Moist Heat Location  Lumbar Spine;Cervical      Cryotherapy   Number Minutes Cryotherapy  10 Minutes    Cryotherapy Location  Cervical    Type of Cryotherapy  Ice pack      Manual Therapy   Manual Therapy  Myofascial release;Soft tissue  mobilization    Manual therapy comments  Mod Thomas position, supine     Soft tissue mobilization  L proximal hip adductors/hip flexors, L UT, LS    Myofascial Release  Manual TPR to L adductors, L UT     Passive ROM  B UT, LS manual stretch with theraspist x 30 sec each way              PT Education - 06/02/18 1219    Education provided  Yes    Education Details  HEP update; red TB issued to pt. for pallof press    Person(s) Educated  Patient    Methods  Explanation;Verbal cues;Handout;Demonstration    Comprehension  Verbalized understanding;Returned demonstration;Verbal cues required;Need further instruction       PT Short Term Goals - 04/25/18 1438      PT SHORT TERM GOAL #1   Title  Independent w/ initial HEP    Status  Achieved        PT Long Term Goals - 05/31/18 1531      PT LONG TERM GOAL #1   Title  Independent w/ ongoing HEP    Status  Partially Met      PT LONG TERM GOAL #2   Title  Pt. will demonstrate L hip strength >/= 4+/5 to improve proximal stability     Status  Partially Met      PT LONG TERM GOAL #3   Title  Pt. will report ability to sit for >/= to 30 minutes w/o low back pain    Status  Achieved      PT LONG TERM GOAL #4   Title  Pt will verbalize/demonstrate understanding of neutral spine posture & proper body mechanics to minimize neck & low back strain    Status  On-going      PT LONG TERM GOAL #5   Title  Pt will demonstrate cervical ROM WFL w/o increased pain to allow him to check blind-spot while driving    Status  On-going            Plan -  06/02/18 1129    Clinical Impression Statement  Stokely reports working on light duty has been going well and has not caused additional back or neck pain.  [name] requesting to focus on back and neck pain in today's visit.  Jerrell able to progress I's, T's, Y's, and tolerated addition of standing Pallof Press well today.  Does continue to complain of L anterior hip/groin pain which was addressed  with manual therapy with some relief reported following this and LE stretching.  HEP updated.  Ended session with moist heat to lumbar spine per pt. request to reduce tone/pain.  Will continue to progress toward goals.    PT Treatment/Interventions  Dry needling;ADLs/Self Care Home Management;Cryotherapy;Iontophoresis 1m/ml Dexamethasone;Moist Heat;Ultrasound;Therapeutic exercise;Therapeutic activities;Neuromuscular re-education;Patient/family education;Manual techniques;Passive range of motion;Taping;Electrical Stimulation;Traction    Consulted and Agree with Plan of Care  Patient       Patient will benefit from skilled therapeutic intervention in order to improve the following deficits and impairments:  Pain, Increased muscle spasms, Decreased activity tolerance, Decreased endurance, Decreased range of motion, Decreased strength, Impaired flexibility, Postural dysfunction  Visit Diagnosis: Acute left-sided low back pain without sciatica  Muscle spasm of back  Muscle weakness (generalized)  Radiculopathy, cervical region  Abnormal posture     Problem List There are no active problems to display for this patient.   MBess Harvest PTA 06/02/18 12:37 PM  [facility] [address] [place] [address]Phone: [phone]  Fax:  [phone] Name: PKY RUMPLEMRN: [id number] of Birth: [birth year]

## 2018-06-05 DIAGNOSIS — M79605 Pain in left leg: Secondary | ICD-10-CM | POA: Diagnosis not present

## 2018-06-07 ENCOUNTER — Ambulatory Visit: Payer: 59

## 2018-06-07 DIAGNOSIS — R293 Abnormal posture: Secondary | ICD-10-CM

## 2018-06-07 DIAGNOSIS — M62830 Muscle spasm of back: Secondary | ICD-10-CM

## 2018-06-07 DIAGNOSIS — M545 Low back pain, unspecified: Secondary | ICD-10-CM

## 2018-06-07 DIAGNOSIS — M5412 Radiculopathy, cervical region: Secondary | ICD-10-CM

## 2018-06-07 DIAGNOSIS — M6281 Muscle weakness (generalized): Secondary | ICD-10-CM

## 2018-06-07 NOTE — Therapy (Signed)
[facility] [address]  [place], [place], [address] Phone: [phone]   Fax:  [phone]  Physical Therapy Treatment  Patient Details  Name: Andrew Blevins MRN: [id number] Date of Birth: 12-03-1961 Referring Provider: Dominica Severin P. Saintclair Halsted, MD   Encounter Date: 06/07/2018  PT End of Session - 06/07/18 1321    Visit Number  17    Number of Visits  20    Date for PT Re-Evaluation  06/21/18    Authorization Type  [employer]    PT Start Time  5956    PT Stop Time  1420    PT Time Calculation (min)  63 min    Activity Tolerance  Patient tolerated treatment well    Behavior During Therapy  WFL for tasks assessed/performed       Past Medical History:  Diagnosis Date  . ED (erectile dysfunction)   . GERD (gastroesophageal reflux disease)   . History of Helicobacter pylori infection 02/2005  . Hypertension     Past Surgical History:  Procedure Laterality Date  . INGUINAL HERNIA REPAIR Left 1997  . SHOULDER SURGERY      There were no vitals filed for this visit.  Subjective Assessment - 06/07/18 1321    Subjective  Pt. noting onset of L buttocks pain following work shift yesterday which he attributes to leaning over L LE while standing.  Pt. noting he has also been using "heel cushions, from "foot store" in work shoes.      Diagnostic tests  Cervical MRI 04/19/18: 1. 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 Notable right C4-5 facet arthritis with moderate marrow edema and mild anterolisthesis; 3. C5-6 spinal stenosis with ventral cord flattening. Right more than left high-grade foraminal impingement; 4. Moderate foraminal narrowing seen on the left at C3-4, bilaterally at C4-5, and left at C6-7.    Patient Stated Goals  Strength back in L Leg; Relief of pain in Low Back    Currently in Pain?  Yes    Pain Score  6     Pain Location  Buttocks    Pain Orientation  Left    Pain Descriptors /  Indicators  Throbbing    Pain Type  Acute pain    Pain Radiating Towards  extening into L lateral hip     Pain Onset  1 to 4 weeks ago    Pain Frequency  Intermittent    Aggravating Factors   Prolonged standing    Pain Relieving Factors  Heat     Multiple Pain Sites  Yes    Pain Score  2    Pain Location  Neck    Pain Orientation  Left;Mid    Pain Descriptors / Indicators  -- "stiffness"     Pain Type  Acute pain    Pain Onset  More than a month ago    Pain Frequency  Intermittent    Aggravating Factors   rotating head to L     Pain Relieving Factors  Keeping head straight                        [facility] Adult PT Treatment/Exercise - 06/07/18 1340      Self-Care   Self-Care  Posture;Other Self-Care Comments    Posture  Instruction on proper standing posture as to reduce cervical and lumbar strain with work related standing     Other Self-Care  Comments   instruction on proper body mechanics with lifting, bending, and pushing/pulling to reduce lumbar and cervical strain at work       Neck Exercises: Seated   Money  10 reps;3 secs red TB seated     Other Seated Exercise  B shoulder horizontal abduction with red TB 3" scap squeeze x 10 reps       Lumbar Exercises: Stretches   Passive Hamstring Stretch  Left;2 reps;30 seconds strap     Piriformis Stretch  Left;2 reps;30 seconds    Figure 4 Stretch  2 reps;30 seconds      Lumbar Exercises: Aerobic   Nustep  Lvl 7, 7 min (UE/LE)      Lumbar Exercises: Standing   Other Standing Lumbar Exercises  Forward/backward, side stepping with red looped TB at ankles 2 x 30 ft each way       Lumbar Exercises: Seated   Other Seated Lumbar Exercises  B shoulder extension with red TB seated red p-ball x 10 reps     Other Seated Lumbar Exercises  B pallof press with double red TB seated on red P-ball x 10 rpes each way       Lumbar Exercises: Quadruped   Straight Leg Raise  10 reps;3 seconds      Moist Heat Therapy   Number  Minutes Moist Heat  15 Minutes    Moist Heat Location  Hip R buttocks       Electrical Stimulation   Electrical Stimulation Location  R buttocks     Electrical Stimulation Action  IFC    Electrical Stimulation Parameters  to tolerance, 15'    Electrical Stimulation Goals  Pain;Tone      Manual Therapy   Manual Therapy  Myofascial release;Soft tissue mobilization    Manual therapy comments  Sidelying with LE elevated on bolster     Soft tissue mobilization  L buttocks/piriformis; pt. noting relief with this     Myofascial Release  manual TPR to L piriformis; good relief following              PT Education - 06/07/18 1415    Education provided  Yes    Education Details  HEP update with work stretch: seated HS, seated mod piri stretch, self-glute release with tennis ball on wall;  posture and body mechanics instructional handout     Person(s) Educated  Patient    Methods  Explanation;Demonstration;Verbal cues;Handout    Comprehension  Verbalized understanding;Returned demonstration;Verbal cues required;Need further instruction       PT Short Term Goals - 04/25/18 1438      PT SHORT TERM GOAL #1   Title  Independent w/ initial HEP    Status  Achieved        PT Long Term Goals - 05/31/18 1531      PT LONG TERM GOAL #1   Title  Independent w/ ongoing HEP    Status  Partially Met      PT LONG TERM GOAL #2   Title  Pt. will demonstrate L hip strength >/= 4+/5 to improve proximal stability     Status  Partially Met      PT LONG TERM GOAL #3   Title  Pt. will report ability to sit for >/= to 30 minutes w/o low back pain    Status  Achieved      PT LONG TERM GOAL #4   Title  Pt will verbalize/demonstrate understanding of neutral spine posture & proper  body mechanics to minimize neck & low back strain    Status  On-going      PT LONG TERM GOAL #5   Title  Pt will demonstrate cervical ROM WFL w/o increased pain to allow him to check blind-spot while driving    Status   On-going            Plan - 06/07/18 1338    Clinical Impression Statement  Andrew Blevins primary complaint today was R buttocks pain, which he feels was triggered by prolonged standing at work.  Did discuss strategies with Sallie today regarding altering positions and mechanics at work along with LE stretching as to reduce pain and tone in hip musculature as he responded well to manual therapy today in session.  HEP updated with LE stretching to be performed at work and self-ball release to buttocks on wall.  Pt. reporting good relief from manual therapy and therex today noting decrease in neck and buttocks pain following session.  Ended session with E-stim/moist heat to L buttocks to further decrease pain and tone with good relief.  Pt. noting lower back pain has improved 90% since starting therapy and reports neck continues to improve along with reduction in groin and hip flexor "tightness" following last session.  Will continue to progress toward goals.      PT Treatment/Interventions  Dry needling;ADLs/Self Care Home Management;Cryotherapy;Iontophoresis 74m/ml Dexamethasone;Moist Heat;Ultrasound;Therapeutic exercise;Therapeutic activities;Neuromuscular re-education;Patient/family education;Manual techniques;Passive range of motion;Taping;Electrical Stimulation;Traction    Consulted and Agree with Plan of Care  Patient       Patient will benefit from skilled therapeutic intervention in order to improve the following deficits and impairments:  Pain, Increased muscle spasms, Decreased activity tolerance, Decreased endurance, Decreased range of motion, Decreased strength, Impaired flexibility, Postural dysfunction  Visit Diagnosis: Acute left-sided low back pain without sciatica  Muscle spasm of back  Muscle weakness (generalized)  Radiculopathy, cervical region  Abnormal posture     Problem List There are no active problems to display for this patient.   MBess Harvest PTA 06/07/18 2:40  PM  [facility] [address] [place] [address]Phone: [phone]  Fax:  [phone] Name: Andrew LUKASMRN: [id number] of Birth: [birth year]

## 2018-06-07 NOTE — Patient Instructions (Addendum)

## 2018-06-09 ENCOUNTER — Ambulatory Visit: Payer: 59

## 2018-06-09 DIAGNOSIS — M5412 Radiculopathy, cervical region: Secondary | ICD-10-CM

## 2018-06-09 DIAGNOSIS — M6281 Muscle weakness (generalized): Secondary | ICD-10-CM

## 2018-06-09 DIAGNOSIS — M62830 Muscle spasm of back: Secondary | ICD-10-CM

## 2018-06-09 DIAGNOSIS — M545 Low back pain, unspecified: Secondary | ICD-10-CM

## 2018-06-09 DIAGNOSIS — R293 Abnormal posture: Secondary | ICD-10-CM

## 2018-06-09 NOTE — Therapy (Signed)
[facility] [address]  [place], [place], [address] Phone: [phone]   Fax:  [phone]  Physical Therapy Treatment  Patient Details  Name: Andrew Blevins MRN: [id number] Date of Birth: May 05, 1961 Referring Provider: Dominica Severin P. Saintclair Halsted, MD   Encounter Date: 06/09/2018  PT End of Session - 06/09/18 1322    Visit Number  18    Number of Visits  20    Date for PT Re-Evaluation  06/21/18    Authorization Type  [employer]    PT Start Time  1319    PT Stop Time  1415    PT Time Calculation (min)  56 min    Activity Tolerance  Patient tolerated treatment well    Behavior During Therapy  WFL for tasks assessed/performed       Past Medical History:  Diagnosis Date  . ED (erectile dysfunction)   . GERD (gastroesophageal reflux disease)   . History of Helicobacter pylori infection 02/2005  . Hypertension     Past Surgical History:  Procedure Laterality Date  . INGUINAL HERNIA REPAIR Left 1997  . SHOULDER SURGERY      There were no vitals filed for this visit.  Subjective Assessment - 06/09/18 1322    Subjective  Pt. noting improvement in buttocks pain since manual therapy in last visit.     Diagnostic tests  Cervical MRI 04/19/18: 1. 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 Notable right C4-5 facet arthritis with moderate marrow edema and mild anterolisthesis; 3. C5-6 spinal stenosis with ventral cord flattening. Right more than left high-grade foraminal impingement; 4. Moderate foraminal narrowing seen on the left at C3-4, bilaterally at C4-5, and left at C6-7.    Patient Stated Goals  Strength back in L Leg; Relief of pain in Low Back    Currently in Pain?  Yes    Pain Score  5     Pain Location  Buttocks    Pain Orientation  Left    Pain Descriptors / Indicators  Throbbing;Aching    Pain Type  Acute pain    Pain Onset  1 to 4 weeks ago    Pain Frequency  Intermittent    Aggravating  Factors   prolonged standing, walking                        [facility] Adult PT Treatment/Exercise - 06/09/18 1345      Lumbar Exercises: Stretches   Gastroc Stretch  Left;2 reps;20 seconds      Lumbar Exercises: Aerobic   UBE (Upper Arm Bike)  Lvl 2.5, 3 min forw/3 min back      Lumbar Exercises: Standing   Functional Squats  15 reps;5 seconds    Functional Squats Limitations  TRX squat to low box with airex  improved tolerance for deep squatting       Lumbar Exercises: Quadruped   Madcat/Old Horse  10 reps    Opposite Arm/Leg Raise  Left arm/Right leg;Right arm/Left leg;10 reps      Moist Heat Therapy   Number Minutes Moist Heat  15 Minutes    Moist Heat Location  Hip L buttocks       Electrical Stimulation   Electrical Stimulation Location  L buttocks     Electrical Stimulation Action  IFC    Electrical Stimulation Parameters  to tolerance, 15'    Electrical Stimulation Goals  Pain;Tone  Manual Therapy   Manual Therapy  Myofascial release;Soft tissue mobilization;Passive ROM    Manual therapy comments  Sidelying with LE elevated on bolster     Soft tissue mobilization  L buttocks/piriformis  Noted good relief following this     Myofascial Release  manual TPR to L piriformis  Noted good relief following this     Passive ROM  Cervical PROM all directions with B UT, LS stretch x 30 sec each direction                PT Short Term Goals - 04/25/18 1438      PT SHORT TERM GOAL #1   Title  Independent w/ initial HEP    Status  Achieved        PT Long Term Goals - 06/09/18 1832      PT LONG TERM GOAL #1   Title  Independent w/ ongoing HEP    Status  Partially Met      PT LONG TERM GOAL #2   Title  Pt. will demonstrate L hip strength >/= 4+/5 to improve proximal stability     Status  Partially Met      PT LONG TERM GOAL #3   Title  Pt. will report ability to sit for >/= to 30 minutes w/o low back pain    Status  Achieved      PT LONG TERM  GOAL #4   Title  Pt will verbalize/demonstrate understanding of neutral spine posture & proper body mechanics to minimize neck & low back strain    Status  Achieved      PT LONG TERM GOAL #5   Title  Pt will demonstrate cervical ROM WFL w/o increased pain to allow him to check blind-spot while driving    Status  On-going            Plan - 06/09/18 1323    Clinical Impression Statement  Andrew Blevins reporting he continues to improvement in neck pain, no longer has significant LBP, and only has mild L groin/hip flexor pain now.  Primary complaint today was L buttocks pain.  Feels he had a day of relief from buttocks pain following last visits manual therapy in this area.  Responded well to STM/DTM/TPR to piriformis and strengthening therex focused on glute activation today however pt. requested E-stim/moist heat to buttocks area as he notes good benefit from this in past sessions.  Cervical ROM seems to be normalizing however not formally measured.  Pt. with two remaining visits left in current POC and seems to be progressing well toward goals.      PT Treatment/Interventions  Dry needling;ADLs/Self Care Home Management;Cryotherapy;Iontophoresis '4mg'$ /ml Dexamethasone;Moist Heat;Ultrasound;Therapeutic exercise;Therapeutic activities;Neuromuscular re-education;Patient/family education;Manual techniques;Passive range of motion;Taping;Electrical Stimulation;Traction    Consulted and Agree with Plan of Care  Patient       Patient will benefit from skilled therapeutic intervention in order to improve the following deficits and impairments:  Pain, Increased muscle spasms, Decreased activity tolerance, Decreased endurance, Decreased range of motion, Decreased strength, Impaired flexibility, Postural dysfunction  Visit Diagnosis: Acute left-sided low back pain without sciatica  Muscle spasm of back  Muscle weakness (generalized)  Radiculopathy, cervical region  Abnormal posture     Problem  List There are no active problems to display for this patient.   Bess Harvest, PTA 06/09/18 6:32 PM  [facility] [address]  [place], [place], [address] Phone: [phone]   Fax:  [phone]  Name: Andrew Blevins MRN: [id number] Date of Birth: 16-Jan-1961

## 2018-06-14 ENCOUNTER — Ambulatory Visit: Payer: 59 | Attending: Neurosurgery

## 2018-06-14 DIAGNOSIS — M5412 Radiculopathy, cervical region: Secondary | ICD-10-CM | POA: Insufficient documentation

## 2018-06-14 DIAGNOSIS — M25552 Pain in left hip: Secondary | ICD-10-CM | POA: Diagnosis present

## 2018-06-14 DIAGNOSIS — M6281 Muscle weakness (generalized): Secondary | ICD-10-CM

## 2018-06-14 DIAGNOSIS — R293 Abnormal posture: Secondary | ICD-10-CM | POA: Diagnosis present

## 2018-06-14 DIAGNOSIS — M545 Low back pain, unspecified: Secondary | ICD-10-CM

## 2018-06-14 DIAGNOSIS — M62830 Muscle spasm of back: Secondary | ICD-10-CM | POA: Diagnosis not present

## 2018-06-14 DIAGNOSIS — R262 Difficulty in walking, not elsewhere classified: Secondary | ICD-10-CM | POA: Diagnosis present

## 2018-06-14 NOTE — Therapy (Signed)
[facility] [address]  [place], [place], [address] Phone: [phone]   Fax:  [phone]  Physical Therapy Treatment  Patient Details  Name: Andrew Blevins MRN: [id number] Date of Birth: Jan 27, 1961 Referring Provider: Dominica Severin P. Saintclair Halsted, MD   Encounter Date: 06/14/2018  PT End of Session - 06/14/18 1325    Visit Number  19    Number of Visits  20    Date for PT Re-Evaluation  06/21/18    Authorization Type  [employer]    PT Start Time  1320    PT Stop Time  1416 ended session with 15' moist heat    PT Time Calculation (min)  56 min    Activity Tolerance  Patient tolerated treatment well    Behavior During Therapy  WFL for tasks assessed/performed       Past Medical History:  Diagnosis Date  . ED (erectile dysfunction)   . GERD (gastroesophageal reflux disease)   . History of Helicobacter pylori infection 02/2005  . Hypertension     Past Surgical History:  Procedure Laterality Date  . INGUINAL HERNIA REPAIR Left 1997  . SHOULDER SURGERY      There were no vitals filed for this visit.  Subjective Assessment - 06/14/18 1322    Subjective  Pt. noting some L hip pain "over bone" which started yesterday.  Pt. noting improvement in ability to check blindspot in car     How long can you sit comfortably?  not limited     How long can you stand comfortably?  1 hour     How long can you walk comfortably?  20 min     Diagnostic tests  Cervical MRI 04/19/18: 1. 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 Notable right C4-5 facet arthritis with moderate marrow edema and mild anterolisthesis; 3. C5-6 spinal stenosis with ventral cord flattening. Right more than left high-grade foraminal impingement; 4. Moderate foraminal narrowing seen on the left at C3-4, bilaterally at C4-5, and left at C6-7.    Patient Stated Goals  Strength back in L Leg; Relief of pain in Low Back    Currently in Pain?  Yes     Pain Score  4     Pain Location  Trochanter    Pain Orientation  Left    Pain Descriptors / Indicators  Aching    Pain Type  Acute pain    Pain Onset  In the past 7 days    Pain Frequency  Intermittent    Aggravating Factors   walking, standing     Pain Relieving Factors  heat          [facility] PT Assessment - 06/14/18 1335      AROM   Cervical Flexion  50    Cervical Extension  52    Cervical - Right Side Bend  37    Cervical - Left Side Bend  35    Cervical - Right Rotation  63    Cervical - Left Rotation  61                   [facility] Adult PT Treatment/Exercise - 06/14/18 1610      Self-Care   Self-Care  Other Self-Care Comments    Other Self-Care Comments   Review and discussion of comprehensive HEP with update of band resistance and notes written in pen on each LE stretch as to direct pt.  on symptoms/tightness that would indicate these LE stretches as pt. verbalizing poor understanding of rationale for LE stretching       Lumbar Exercises: Stretches   Hip Flexor Stretch  Left;1 rep;60 seconds    Hip Flexor Stretch Limitations  prone with strap     ITB Stretch  Left;2 reps;30 seconds    ITB Stretch Limitations  supine with strap     Piriformis Stretch  Left;2 reps;30 seconds strap assist     Figure 4 Stretch  2 reps;30 seconds strap assist       Lumbar Exercises: Aerobic   Recumbent Bike  L3 x 6'       Moist Heat Therapy   Number Minutes Moist Heat  15 Minutes    Moist Heat Location  Hip L             PT Education - 06/14/18 1615    Education provided  Yes    Education Details  Comprehensive HEP review handout: LE stretches, prone hip flexor, supine HS, supine ITB, SKTC seated glute (work), seated piriformis (work), Standing half-kneeling hip flexor lunge stretch on chair, standing glute ball release (work), supine abduction bridge with green TB, sidelying clam shell with green TB, Standing row and extension row with green TB, seated chin tuck,  sitting/standing postural education, Seated march with green TB at knees, Seated lumbar p-ball roll-outs, Seated LS/UT stretch, Standing pec stretch on doorway;  Pt. has green TB at home for row activities and looped green band for supine activities.      Person(s) Educated  Patient    Methods  Handout;Explanation;Verbal cues    Comprehension  Verbalized understanding;Verbal cues required;Need further instruction       PT Short Term Goals - 04/25/18 1438      PT SHORT TERM GOAL #1   Title  Independent w/ initial HEP    Status  Achieved        PT Long Term Goals - 06/14/18 1337      PT LONG TERM GOAL #1   Title  Independent w/ ongoing HEP    Status  Partially Met      PT LONG TERM GOAL #2   Title  Pt. will demonstrate L hip strength >/= 4+/5 to improve proximal stability     Status  Partially Met      PT LONG TERM GOAL #3   Title  Pt. will report ability to sit for >/= to 30 minutes w/o low back pain    Status  Achieved      PT LONG TERM GOAL #4   Title  Pt will verbalize/demonstrate understanding of neutral spine posture & proper body mechanics to minimize neck & low back strain    Status  Achieved      PT LONG TERM GOAL #5   Title  Pt will demonstrate cervical ROM WFL w/o increased pain to allow him to check blind-spot while driving    Status  Partially Met Still limited in B cerv. lateral flexion             Plan - 06/14/18 1326    Clinical Impression Statement  Pt. noting ~ 70% improvement in neck pain since starting therapy.  Noting ~ 90 % improvement in back pain since staring therapy.  Primary concern today was L lateral hip pain which he states started at end of work shift yesterday.  Pt. ttp over L greater trochanter with palpation and continues be tight in L proximal and  lateral hip musculature which was addressed with LE stretches.  Reviewed previously issued HEP LE stretches with pt. today and provided direction for [name] as to appropriate stretches to perform  at work.  Pt. made aware that next treatment is last visit in current POC with pt. verbalizing understanding however expressing that he is unsure if he is ready to finish PT.  Pt. able to partially meet cervical ROM goal today with B cervical rotation [facility] and pt. noting much greater ease checking blind spot while driving.  Pt. has now achieved or partially achieved all LTG's and to see supervising PT for final visit in current POC.  Pt. on track to meet remaining goals and reviewed comprehensive HEP with annotations written on final HEP handout as to direct pt. in rationale for each LE stretch as it relates to current symptoms.      PT Treatment/Interventions  Dry needling;ADLs/Self Care Home Management;Cryotherapy;Iontophoresis 53m/ml Dexamethasone;Moist Heat;Ultrasound;Therapeutic exercise;Therapeutic activities;Neuromuscular re-education;Patient/family education;Manual techniques;Passive range of motion;Taping;Electrical Stimulation;Traction    Consulted and Agree with Plan of Care  Patient       Patient will benefit from skilled therapeutic intervention in order to improve the following deficits and impairments:  Pain, Increased muscle spasms, Decreased activity tolerance, Decreased endurance, Decreased range of motion, Decreased strength, Impaired flexibility, Postural dysfunction  Visit Diagnosis: Acute left-sided low back pain without sciatica  Muscle spasm of back  Muscle weakness (generalized)  Radiculopathy, cervical region  Abnormal posture  Pain in left hip  Difficulty in walking, not elsewhere classified     Problem List There are no active problems to display for this patient.   MBess Harvest PTA 06/15/18 7:54 AM   [facility] [place] [address]Phone: [phone]  Fax:  [phone] Name: PBISHOY CUPPMRN: [id number] of Birth: 308-21-1962

## 2018-06-21 ENCOUNTER — Encounter: Payer: Self-pay | Admitting: Physical Therapy

## 2018-06-21 ENCOUNTER — Ambulatory Visit: Payer: 59 | Admitting: Physical Therapy

## 2018-06-21 DIAGNOSIS — M6281 Muscle weakness (generalized): Secondary | ICD-10-CM

## 2018-06-21 DIAGNOSIS — M545 Low back pain, unspecified: Secondary | ICD-10-CM

## 2018-06-21 DIAGNOSIS — M62830 Muscle spasm of back: Secondary | ICD-10-CM

## 2018-06-21 NOTE — Therapy (Addendum)
[facility] [address]  [place], [place], [address] Phone: [phone]   Fax:  [phone]  Physical Therapy Treatment  Patient Details  Name: Andrew Blevins MRN: [id number] Date of Birth: 12/23/1960 Referring Provider: Dominica Severin P. Saintclair Halsted, MD   Encounter Date: 06/21/2018  PT End of Session - 06/21/18 1355    Visit Number  20    Number of Visits  20    Date for PT Re-Evaluation  06/21/18    Authorization Type  [employer]    PT Start Time  3500    PT Stop Time  1403    PT Time Calculation (min)  46 min    Activity Tolerance  Patient tolerated treatment well    Behavior During Therapy  WFL for tasks assessed/performed       Past Medical History:  Diagnosis Date  . ED (erectile dysfunction)   . GERD (gastroesophageal reflux disease)   . History of Helicobacter pylori infection 02/2005  . Hypertension     Past Surgical History:  Procedure Laterality Date  . INGUINAL HERNIA REPAIR Left 1997  . SHOULDER SURGERY      There were no vitals filed for this visit.  Subjective Assessment - 06/21/18 1319    Subjective  pt continues to note some pain in his L hip area and tightness over the L shoulder. Pain extends from the top portion of his hip to about midway down the lateral part of his thigh.     How long can you sit comfortably?  not limited     How long can you stand comfortably?  45 min    How long can you walk comfortably?  15 min     Diagnostic tests  Cervical MRI 04/19/18: 1. 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 Notable right C4-5 facet arthritis with moderate marrow edema and mild anterolisthesis; 3. C5-6 spinal stenosis with ventral cord flattening. Right more than left high-grade foraminal impingement; 4. Moderate foraminal narrowing seen on the left at C3-4, bilaterally at C4-5, and left at C6-7.    Patient Stated Goals  Strength back in L Leg; Relief of pain in Low Back    Currently in Pain?  Yes    Pain Score  4     Pain Location  Trochanter    Pain Descriptors / Indicators  Aching    Pain Type  Acute pain    Multiple Pain Sites  Yes    Pain Score  2    Pain Location  Neck    Pain Orientation  Left    Pain Descriptors / Indicators  Tightness    Pain Type  Acute pain         [facility] PT Assessment - 06/21/18 1356      Observation/Other Assessments   Focus on Therapeutic Outcomes (FOTO)   Predicted 61%(39% limitation); Status 74% (26% limitation)      AROM   Cervical Flexion  WNL    Cervical Extension  WNL    Cervical - Right Side Bend  50% limited at beginning of session, 25% limited at end of session    Cervical - Left Side Bend  50% limited at beginning of session, 25% limited at end of session    Cervical - Right Rotation  WNL    Cervical - Left Rotation  WNL      Strength   Right/Left Hip  Right;Left    Right Hip  Flexion  4+/5    Right Hip Extension  4/5    Right Hip ABduction  5/5    Right Hip ADduction  5/5    Left Hip Flexion  4-/5    Left Hip Extension  4/5    Left Hip ABduction  5/5    Left Hip ADduction  5/5                   [facility] Adult PT Treatment/Exercise - 06/21/18 1322      Lumbar Exercises: Aerobic   Stationary Bike  L6 x 6 min      Manual Therapy   Manual Therapy  Myofascial release    Manual therapy comments  R Sidelying with L UE supported on bolster    Soft tissue mobilization  L Glute Med/Min, Piriformis    Myofascial Release  L Piriformis, Glute Med/Min, L Upper Trap       Trigger Point Dry Needling - 06/21/18 1317    Consent Given?  Yes    Education Handout Provided  No pt declined - has had prior DN    Muscles Treated Upper Body  Upper trapezius    Muscles Treated Lower Body  Piriformis    Upper Trapezius Response  Twitch reponse elicited;Palpable increased muscle length    Piriformis Response  Twitch response elicited;Palpable increased muscle length             PT Short Term Goals  - 04/25/18 1438      PT SHORT TERM GOAL #1   Title  Independent w/ initial HEP    Status  Achieved        PT Long Term Goals - 06/21/18 1707      PT LONG TERM GOAL #1   Title  Independent w/ ongoing HEP    Status  Achieved      PT LONG TERM GOAL #2   Title  Pt. will demonstrate L hip strength >/= 4+/5 to improve proximal stability     Status  Partially Met      PT LONG TERM GOAL #3   Title  Pt. will report ability to sit for >/= to 30 minutes w/o low back pain    Status  Achieved      PT LONG TERM GOAL #4   Title  Pt will verbalize/demonstrate understanding of neutral spine posture & proper body mechanics to minimize neck & low back strain    Status  Achieved      PT LONG TERM GOAL #5   Title  Pt will demonstrate cervical ROM WFL w/o increased pain to allow him to check blind-spot while driving    Status  Achieved            Plan - 06/21/18 1756    Clinical Impression Statement  Andrew Blevins has responded well to physical therapy and has either met or partially met all of his long-term goals. At this time he demonstrates consistently decreased pain levels and improved tolerance to standing activities, which are required of him at work. HEP exercises were reviewed with pt last session and was instructed to continue with these as indicated. Pt was in agreement with this plan and instructed to call with any questions.  At this time pt will be placed on a 30 day hold in case any issues arise or pt experiences a significant change in status within that time period.     Rehab Potential  Good    PT Next Visit Plan  Pt to be placed on hold for 30 days    Consulted and Agree with Plan of Care  Patient       Patient will benefit from skilled therapeutic intervention in order to improve the following deficits and impairments:     Visit Diagnosis: Acute left-sided low back pain without sciatica  Muscle spasm of back  Muscle weakness (generalized)     Problem List There are no  active problems to display for this patient.   Shirline Frees, SPT 06/21/2018, 6:51 PM  [facility] [address]  [place], [place], [address] Phone: [phone]   Fax:  [phone]  Name: Andrew Blevins MRN: [id number] Date of Birth: [birth year]   PHYSICAL THERAPY DISCHARGE SUMMARY  Visits from Start of Care: 20  Current functional level related to goals / functional outcomes: Refer to above clinical impression for status as of last visit on 06/21/2018. Pt was placed on hold for 30 days and has not needed to return to PT, therefore will proceed with discharge from PT for this episode.    Remaining deficits:  See clinical impression.   Education / Equipment: Pt provided with HEP program.  Plan: Patient agrees to discharge.  Patient goals were mostly met. Patient is being discharged due to meeting the stated rehab goals.  ?????    Shirline Frees, SPT 07/29/18, 10:33 AM
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
## 2018-07-05 DIAGNOSIS — M79605 Pain in left leg: Secondary | ICD-10-CM | POA: Diagnosis not present

## 2018-07-11 DIAGNOSIS — M5440 Lumbago with sciatica, unspecified side: Secondary | ICD-10-CM | POA: Diagnosis not present

## 2018-07-11 DIAGNOSIS — M25552 Pain in left hip: Secondary | ICD-10-CM | POA: Diagnosis not present

## 2018-07-11 DIAGNOSIS — M25512 Pain in left shoulder: Secondary | ICD-10-CM | POA: Diagnosis not present

## 2018-07-14 DIAGNOSIS — M25512 Pain in left shoulder: Secondary | ICD-10-CM | POA: Diagnosis not present

## 2018-07-14 DIAGNOSIS — M25552 Pain in left hip: Secondary | ICD-10-CM | POA: Diagnosis not present

## 2018-07-14 DIAGNOSIS — M5440 Lumbago with sciatica, unspecified side: Secondary | ICD-10-CM | POA: Diagnosis not present

## 2018-07-18 DIAGNOSIS — M25512 Pain in left shoulder: Secondary | ICD-10-CM | POA: Diagnosis not present

## 2018-07-18 DIAGNOSIS — M5440 Lumbago with sciatica, unspecified side: Secondary | ICD-10-CM | POA: Diagnosis not present

## 2018-07-18 DIAGNOSIS — M25552 Pain in left hip: Secondary | ICD-10-CM | POA: Diagnosis not present

## 2018-07-21 DIAGNOSIS — M5440 Lumbago with sciatica, unspecified side: Secondary | ICD-10-CM | POA: Diagnosis not present

## 2018-07-21 DIAGNOSIS — M25512 Pain in left shoulder: Secondary | ICD-10-CM | POA: Diagnosis not present

## 2018-07-21 DIAGNOSIS — M25552 Pain in left hip: Secondary | ICD-10-CM | POA: Diagnosis not present

## 2018-07-26 DIAGNOSIS — M25552 Pain in left hip: Secondary | ICD-10-CM | POA: Diagnosis not present

## 2018-07-26 DIAGNOSIS — M25512 Pain in left shoulder: Secondary | ICD-10-CM | POA: Diagnosis not present

## 2018-07-26 DIAGNOSIS — M5440 Lumbago with sciatica, unspecified side: Secondary | ICD-10-CM | POA: Diagnosis not present

## 2018-08-01 DIAGNOSIS — M25552 Pain in left hip: Secondary | ICD-10-CM | POA: Diagnosis not present

## 2018-08-01 DIAGNOSIS — M5440 Lumbago with sciatica, unspecified side: Secondary | ICD-10-CM | POA: Diagnosis not present

## 2018-08-01 DIAGNOSIS — M25512 Pain in left shoulder: Secondary | ICD-10-CM | POA: Diagnosis not present

## 2018-08-03 DIAGNOSIS — M25552 Pain in left hip: Secondary | ICD-10-CM | POA: Diagnosis not present

## 2018-08-03 DIAGNOSIS — M25512 Pain in left shoulder: Secondary | ICD-10-CM | POA: Diagnosis not present

## 2018-08-03 DIAGNOSIS — M5440 Lumbago with sciatica, unspecified side: Secondary | ICD-10-CM | POA: Diagnosis not present

## 2018-08-05 DIAGNOSIS — M79605 Pain in left leg: Secondary | ICD-10-CM | POA: Diagnosis not present

## 2018-08-08 DIAGNOSIS — M5440 Lumbago with sciatica, unspecified side: Secondary | ICD-10-CM | POA: Diagnosis not present

## 2018-08-08 DIAGNOSIS — M25512 Pain in left shoulder: Secondary | ICD-10-CM | POA: Diagnosis not present

## 2018-08-08 DIAGNOSIS — M25552 Pain in left hip: Secondary | ICD-10-CM | POA: Diagnosis not present

## 2018-08-10 DIAGNOSIS — M5440 Lumbago with sciatica, unspecified side: Secondary | ICD-10-CM | POA: Diagnosis not present

## 2018-08-10 DIAGNOSIS — M25512 Pain in left shoulder: Secondary | ICD-10-CM | POA: Diagnosis not present

## 2018-08-10 DIAGNOSIS — M25552 Pain in left hip: Secondary | ICD-10-CM | POA: Diagnosis not present

## 2018-08-16 DIAGNOSIS — M25512 Pain in left shoulder: Secondary | ICD-10-CM | POA: Diagnosis not present

## 2018-08-16 DIAGNOSIS — M5440 Lumbago with sciatica, unspecified side: Secondary | ICD-10-CM | POA: Diagnosis not present

## 2018-08-16 DIAGNOSIS — M25552 Pain in left hip: Secondary | ICD-10-CM | POA: Diagnosis not present

## 2018-08-18 DIAGNOSIS — M25552 Pain in left hip: Secondary | ICD-10-CM | POA: Diagnosis not present

## 2018-08-18 DIAGNOSIS — M5440 Lumbago with sciatica, unspecified side: Secondary | ICD-10-CM | POA: Diagnosis not present

## 2018-08-18 DIAGNOSIS — M25512 Pain in left shoulder: Secondary | ICD-10-CM | POA: Diagnosis not present

## 2018-08-24 DIAGNOSIS — M5440 Lumbago with sciatica, unspecified side: Secondary | ICD-10-CM | POA: Diagnosis not present

## 2018-08-24 DIAGNOSIS — M25552 Pain in left hip: Secondary | ICD-10-CM | POA: Diagnosis not present

## 2018-08-24 DIAGNOSIS — M25512 Pain in left shoulder: Secondary | ICD-10-CM | POA: Diagnosis not present

## 2018-08-30 DIAGNOSIS — M25512 Pain in left shoulder: Secondary | ICD-10-CM | POA: Diagnosis not present

## 2018-08-30 DIAGNOSIS — M5440 Lumbago with sciatica, unspecified side: Secondary | ICD-10-CM | POA: Diagnosis not present

## 2018-08-30 DIAGNOSIS — M25552 Pain in left hip: Secondary | ICD-10-CM | POA: Diagnosis not present

## 2018-09-05 DIAGNOSIS — M79605 Pain in left leg: Secondary | ICD-10-CM | POA: Diagnosis not present

## 2018-09-05 DIAGNOSIS — Z23 Encounter for immunization: Secondary | ICD-10-CM | POA: Diagnosis not present

## 2018-09-17 DIAGNOSIS — M79662 Pain in left lower leg: Secondary | ICD-10-CM | POA: Diagnosis not present

## 2018-09-17 DIAGNOSIS — S86812A Strain of other muscle(s) and tendon(s) at lower leg level, left leg, initial encounter: Secondary | ICD-10-CM | POA: Diagnosis not present

## 2018-09-21 DIAGNOSIS — E782 Mixed hyperlipidemia: Secondary | ICD-10-CM | POA: Diagnosis not present

## 2018-09-21 DIAGNOSIS — K219 Gastro-esophageal reflux disease without esophagitis: Secondary | ICD-10-CM | POA: Diagnosis not present

## 2018-09-21 DIAGNOSIS — I10 Essential (primary) hypertension: Secondary | ICD-10-CM | POA: Diagnosis not present

## 2018-09-21 DIAGNOSIS — Z0000 Encounter for general adult medical examination without abnormal findings: Secondary | ICD-10-CM | POA: Diagnosis not present

## 2018-09-21 DIAGNOSIS — Z1389 Encounter for screening for other disorder: Secondary | ICD-10-CM | POA: Diagnosis not present

## 2018-09-29 DIAGNOSIS — M7989 Other specified soft tissue disorders: Secondary | ICD-10-CM | POA: Diagnosis not present

## 2018-09-30 ENCOUNTER — Other Ambulatory Visit (HOSPITAL_COMMUNITY): Payer: Self-pay | Admitting: Neurosurgery

## 2018-09-30 ENCOUNTER — Ambulatory Visit (HOSPITAL_COMMUNITY)
Admission: RE | Admit: 2018-09-30 | Discharge: 2018-09-30 | Disposition: A | Discharge status: Home / Self Care | Payer: 59 | Source: Ambulatory Visit | Attending: Vascular Surgery | Admitting: Vascular Surgery

## 2018-09-30 DIAGNOSIS — R609 Edema, unspecified: Secondary | ICD-10-CM

## 2018-10-05 DIAGNOSIS — M79605 Pain in left leg: Secondary | ICD-10-CM | POA: Diagnosis not present

## 2018-10-21 DIAGNOSIS — M25532 Pain in left wrist: Secondary | ICD-10-CM | POA: Diagnosis not present

## 2018-10-21 DIAGNOSIS — M79641 Pain in right hand: Secondary | ICD-10-CM | POA: Diagnosis not present

## 2018-11-05 DIAGNOSIS — M79605 Pain in left leg: Secondary | ICD-10-CM | POA: Diagnosis not present

## 2018-11-17 DIAGNOSIS — H40013 Open angle with borderline findings, low risk, bilateral: Secondary | ICD-10-CM | POA: Diagnosis not present

## 2018-12-01 DIAGNOSIS — M25552 Pain in left hip: Secondary | ICD-10-CM | POA: Diagnosis not present

## 2018-12-05 DIAGNOSIS — M79605 Pain in left leg: Secondary | ICD-10-CM | POA: Diagnosis not present

## 2018-12-27 DIAGNOSIS — M25552 Pain in left hip: Secondary | ICD-10-CM | POA: Diagnosis not present

## 2018-12-27 DIAGNOSIS — M5440 Lumbago with sciatica, unspecified side: Secondary | ICD-10-CM | POA: Diagnosis not present

## 2018-12-27 DIAGNOSIS — M25512 Pain in left shoulder: Secondary | ICD-10-CM | POA: Diagnosis not present

## 2019-01-04 DIAGNOSIS — M5440 Lumbago with sciatica, unspecified side: Secondary | ICD-10-CM | POA: Diagnosis not present

## 2019-01-04 DIAGNOSIS — M25552 Pain in left hip: Secondary | ICD-10-CM | POA: Diagnosis not present

## 2019-01-04 DIAGNOSIS — M25512 Pain in left shoulder: Secondary | ICD-10-CM | POA: Diagnosis not present

## 2019-01-10 DIAGNOSIS — M5440 Lumbago with sciatica, unspecified side: Secondary | ICD-10-CM | POA: Diagnosis not present

## 2019-01-10 DIAGNOSIS — M25552 Pain in left hip: Secondary | ICD-10-CM | POA: Diagnosis not present

## 2019-01-10 DIAGNOSIS — M25512 Pain in left shoulder: Secondary | ICD-10-CM | POA: Diagnosis not present

## 2019-01-11 DIAGNOSIS — G4733 Obstructive sleep apnea (adult) (pediatric): Secondary | ICD-10-CM | POA: Diagnosis not present

## 2019-01-11 DIAGNOSIS — R0602 Shortness of breath: Secondary | ICD-10-CM | POA: Diagnosis not present

## 2019-01-17 DIAGNOSIS — M25512 Pain in left shoulder: Secondary | ICD-10-CM | POA: Diagnosis not present

## 2019-01-17 DIAGNOSIS — M5440 Lumbago with sciatica, unspecified side: Secondary | ICD-10-CM | POA: Diagnosis not present

## 2019-01-17 DIAGNOSIS — M25552 Pain in left hip: Secondary | ICD-10-CM | POA: Diagnosis not present

## 2019-01-24 DIAGNOSIS — M25552 Pain in left hip: Secondary | ICD-10-CM | POA: Diagnosis not present

## 2019-01-24 DIAGNOSIS — M5440 Lumbago with sciatica, unspecified side: Secondary | ICD-10-CM | POA: Diagnosis not present

## 2019-01-24 DIAGNOSIS — M25512 Pain in left shoulder: Secondary | ICD-10-CM | POA: Diagnosis not present

## 2019-01-31 DIAGNOSIS — M5440 Lumbago with sciatica, unspecified side: Secondary | ICD-10-CM | POA: Diagnosis not present

## 2019-01-31 DIAGNOSIS — M25552 Pain in left hip: Secondary | ICD-10-CM | POA: Diagnosis not present

## 2019-01-31 DIAGNOSIS — M25512 Pain in left shoulder: Secondary | ICD-10-CM | POA: Diagnosis not present

## 2019-02-02 DIAGNOSIS — I10 Essential (primary) hypertension: Secondary | ICD-10-CM | POA: Diagnosis not present

## 2019-02-02 DIAGNOSIS — M5440 Lumbago with sciatica, unspecified side: Secondary | ICD-10-CM | POA: Diagnosis not present

## 2019-02-07 DIAGNOSIS — M25512 Pain in left shoulder: Secondary | ICD-10-CM | POA: Diagnosis not present

## 2019-02-07 DIAGNOSIS — M5440 Lumbago with sciatica, unspecified side: Secondary | ICD-10-CM | POA: Diagnosis not present

## 2019-02-07 DIAGNOSIS — M25552 Pain in left hip: Secondary | ICD-10-CM | POA: Diagnosis not present

## 2019-02-11 DIAGNOSIS — R0602 Shortness of breath: Secondary | ICD-10-CM | POA: Diagnosis not present

## 2019-02-11 DIAGNOSIS — G4733 Obstructive sleep apnea (adult) (pediatric): Secondary | ICD-10-CM | POA: Diagnosis not present

## 2019-02-15 DIAGNOSIS — M5126 Other intervertebral disc displacement, lumbar region: Secondary | ICD-10-CM | POA: Diagnosis not present

## 2019-02-15 DIAGNOSIS — Z6838 Body mass index (BMI) 38.0-38.9, adult: Secondary | ICD-10-CM | POA: Diagnosis not present

## 2019-03-12 DIAGNOSIS — G4733 Obstructive sleep apnea (adult) (pediatric): Secondary | ICD-10-CM | POA: Diagnosis not present

## 2019-03-12 DIAGNOSIS — R0602 Shortness of breath: Secondary | ICD-10-CM | POA: Diagnosis not present

## 2019-03-23 DIAGNOSIS — I10 Essential (primary) hypertension: Secondary | ICD-10-CM | POA: Diagnosis not present

## 2019-03-23 DIAGNOSIS — N182 Chronic kidney disease, stage 2 (mild): Secondary | ICD-10-CM | POA: Diagnosis not present

## 2019-03-23 DIAGNOSIS — E782 Mixed hyperlipidemia: Secondary | ICD-10-CM | POA: Diagnosis not present

## 2019-04-05 DIAGNOSIS — M5126 Other intervertebral disc displacement, lumbar region: Secondary | ICD-10-CM | POA: Diagnosis not present

## 2019-04-12 DIAGNOSIS — R0602 Shortness of breath: Secondary | ICD-10-CM | POA: Diagnosis not present

## 2019-04-12 DIAGNOSIS — G4733 Obstructive sleep apnea (adult) (pediatric): Secondary | ICD-10-CM | POA: Diagnosis not present

## 2020-05-29 ENCOUNTER — Emergency Department (HOSPITAL_BASED_OUTPATIENT_CLINIC_OR_DEPARTMENT_OTHER)

## 2020-05-29 ENCOUNTER — Encounter (HOSPITAL_BASED_OUTPATIENT_CLINIC_OR_DEPARTMENT_OTHER): Payer: Self-pay | Admitting: *Deleted

## 2020-05-29 ENCOUNTER — Other Ambulatory Visit: Payer: Self-pay

## 2020-05-29 DIAGNOSIS — S60922A Unspecified superficial injury of left hand, initial encounter: Secondary | ICD-10-CM | POA: Insufficient documentation

## 2020-05-29 DIAGNOSIS — W19XXXA Unspecified fall, initial encounter: Secondary | ICD-10-CM | POA: Diagnosis not present

## 2020-05-29 DIAGNOSIS — Y939 Activity, unspecified: Secondary | ICD-10-CM | POA: Diagnosis not present

## 2020-05-29 DIAGNOSIS — S8992XA Unspecified injury of left lower leg, initial encounter: Secondary | ICD-10-CM | POA: Diagnosis present

## 2020-05-29 DIAGNOSIS — S63502A Unspecified sprain of left wrist, initial encounter: Secondary | ICD-10-CM | POA: Diagnosis not present

## 2020-05-29 DIAGNOSIS — Y929 Unspecified place or not applicable: Secondary | ICD-10-CM | POA: Insufficient documentation

## 2020-05-29 DIAGNOSIS — Y999 Unspecified external cause status: Secondary | ICD-10-CM | POA: Insufficient documentation

## 2020-05-29 DIAGNOSIS — I10 Essential (primary) hypertension: Secondary | ICD-10-CM | POA: Diagnosis not present

## 2020-05-29 NOTE — ED Triage Notes (Addendum)
Trip and Fall at work [employer]. Left hand and left knee pain. No obvious deformity noted. No bruising, swelling. Denies hitting head.

## 2020-05-30 ENCOUNTER — Emergency Department (HOSPITAL_BASED_OUTPATIENT_CLINIC_OR_DEPARTMENT_OTHER)
Admission: EM | Admit: 2020-05-30 | Discharge: 2020-05-30 | Disposition: A | Discharge status: Home / Self Care | Attending: Emergency Medicine | Admitting: Emergency Medicine

## 2020-05-30 DIAGNOSIS — S63502A Unspecified sprain of left wrist, initial encounter: Secondary | ICD-10-CM

## 2020-05-30 DIAGNOSIS — W19XXXA Unspecified fall, initial encounter: Secondary | ICD-10-CM

## 2020-05-30 DIAGNOSIS — S8992XA Unspecified injury of left lower leg, initial encounter: Secondary | ICD-10-CM

## 2020-05-30 MED ORDER — NAPROXEN 375 MG PO TABS: ORAL_TABLET | ORAL | 0 refills | Status: AC

## 2020-05-30 MED ORDER — NAPROXEN 250 MG PO TABS
500.0000 mg | ORAL_TABLET | Freq: Once | ORAL | Status: AC
  Administered 2020-05-30: 500 mg via ORAL
  Filled 2020-05-30: qty 2

## 2020-05-30 NOTE — ED Provider Notes (Signed)
[facility] Provider Note: Georgena Spurling, MD, FACEP  CSN: [id number] MRN: [id number] ARRIVAL: 05/29/20 at 2243 ROOM: [place] OF PRESENT ILLNESS  05/30/20 4:20 AM Andrew Blevins is a 59 y.o. male who tripped and fell at work yesterday evening.  He fell onto his left hand and left knee and has pain at the sites.  The pain is worse in his left hand which she rates as an 8 out of 10, worse with movement or palpation.  There is no associated ecchymosis or swelling.  He did not hit his head.    Past Medical History:  Diagnosis Date  . ED (erectile dysfunction)   . GERD (gastroesophageal reflux disease)   . History of Helicobacter pylori infection 02/2005  . Hypertension     Past Surgical History:  Procedure Laterality Date  . BACK SURGERY    . INGUINAL HERNIA REPAIR Left 1997  . SHOULDER SURGERY      Family History  Problem Relation Age of Onset  . Cancer Mother 73  . Hypertension Father   . Gout Father   . Hypertension Sister   . Diabetes Brother   . Hyperlipidemia Brother   . Hypertension Brother   . Heart disease Brother 12       CABG    Social History   Tobacco Use  . Smoking status: Never Smoker  . Smokeless tobacco: Never Used  Substance Use Topics  . Alcohol use: Yes    Comment: occasional  . Drug use: No    Prior to Admission medications   Medication Sig Start Date End Date Taking? Authorizing Provider  naproxen (NAPROSYN) 375 MG tablet Take 1 tablet twice daily as needed for pain. 05/30/20   Mileena Rothenberger, MD  olmesartan (BENICAR) 20 MG tablet Take 20 mg by mouth daily.    [provider]  olmesartan-hydrochlorothiazide (BENICAR HCT) 20-12.5 MG tablet Take 1 tablet by mouth daily.    [provider]  RABEprazole (ACIPHEX) 20 MG tablet Take 20 mg by mouth daily.    [provider]    Allergies Patient has no known allergies.   REVIEW OF SYSTEMS  Negative except as noted  here or in the History of Present Illness.   PHYSICAL EXAMINATION  Initial Vital Signs Blood pressure 129/81, pulse 69, temperature 97.6 F (36.4 C), temperature source Oral, resp. rate 18, height 6' (1.829 m), weight 122.5 kg, SpO2 100 %.  Examination General: Well-developed, well-nourished male in no acute distress; appearance consistent with age of record HENT: normocephalic; atraumatic Eyes: Normal appearance Neck: supple Heart: regular rate and rhythm Lungs: clear to auscultation bilaterally Abdomen: soft; nondistended; nontender; bowel sounds present Extremities: No deformity; full range of motion; tenderness over dorsal left wrist without ecchymosis, swelling or snuffbox tenderness; tenderness over left anterior lateral lower leg just distal to the knee without swelling or ecchymosis Neurologic: Awake, alert and oriented; motor function intact in all extremities and symmetric; no facial droop Skin: Warm and dry Psychiatric: Normal mood and affect   RESULTS  Summary of this visit's results, reviewed and interpreted by myself:   EKG Interpretation  Date/Time:    Ventricular Rate:    PR Interval:    QRS Duration:   QT Interval:    QTC Calculation:   R Axis:     Text Interpretation:        Laboratory Studies: No results found for this or any previous visit (from the  past 24 hour(s)). Imaging Studies: DG Knee Complete 4 Views Left  Result Date: 05/29/2020 CLINICAL DATA:  Tripped and fell, left knee pain EXAM: LEFT KNEE - COMPLETE 4+ VIEW COMPARISON:  None. FINDINGS: Frontal, bilateral oblique, and lateral views of the left knee demonstrate no fractures. There is mild 3 compartmental osteoarthritis greatest in the patellofemoral compartment. Alignment is anatomic. No joint effusion. IMPRESSION: 1. Three compartmental osteoarthritis.  No acute fracture. Electronically Signed   By: Sharlet Salina M.D.   On: 05/29/2020 23:19   DG Hand Complete Left  Result Date:  05/29/2020 CLINICAL DATA:  fall EXAM: LEFT HAND - COMPLETE 3+ VIEW COMPARISON:  None. FINDINGS: There is no evidence of fracture or dislocation. There is no evidence of arthropathy or other focal bone abnormality. Soft tissues are unremarkable. IMPRESSION: Negative. Electronically Signed   By: Charlett Nose M.D.   On: 05/29/2020 23:22    ED COURSE and MDM  Nursing notes, initial and subsequent vitals signs, including pulse oximetry, reviewed and interpreted by myself.  Vitals:   05/29/20 2252 05/29/20 2253 05/30/20 0059  BP:  121/81 129/81  Pulse:  72 69  Resp:  14 18  Temp:  97.6 F (36.4 C)   TempSrc:  Oral   SpO2:  95% 100%  Weight: 122.5 kg    Height: 6' (1.829 m)     Medications  naproxen (NAPROSYN) tablet 500 mg (has no administration in time range)    We will place patient in a Velcro wrist splint for use as needed and placed on naproxen for pain.  PROCEDURES  Procedures   ED DIAGNOSES     ICD-10-CM   1. Fall, initial encounter  W19.XXXA   2. Sprain of left wrist, initial encounter  S63.502A   3. Injury of left knee, initial encounter  S89.92XA        Andrew Blevins, Andrew Ruiz, MD 05/30/20 [phone]

## 2022-04-02 ENCOUNTER — Other Ambulatory Visit: Payer: Self-pay | Admitting: Internal Medicine

## 2022-04-02 DIAGNOSIS — R079 Chest pain, unspecified: Secondary | ICD-10-CM

## 2022-04-09 ENCOUNTER — Ambulatory Visit
Admission: RE | Admit: 2022-04-09 | Discharge: 2022-04-09 | Disposition: A | Discharge status: Home / Self Care | Payer: 59 | Source: Ambulatory Visit | Attending: Internal Medicine | Admitting: Internal Medicine

## 2022-04-09 DIAGNOSIS — R079 Chest pain, unspecified: Secondary | ICD-10-CM

## 2022-05-06 ENCOUNTER — Other Ambulatory Visit: Payer: 59

## 2022-05-15 ENCOUNTER — Other Ambulatory Visit: Payer: Self-pay | Admitting: Neurosurgery

## 2022-05-15 DIAGNOSIS — M542 Cervicalgia: Secondary | ICD-10-CM

## 2022-07-01 ENCOUNTER — Ambulatory Visit
Admission: RE | Admit: 2022-07-01 | Discharge: 2022-07-01 | Disposition: A | Discharge status: Home / Self Care | Payer: 59 | Source: Ambulatory Visit | Attending: Neurosurgery | Admitting: Neurosurgery

## 2022-07-01 DIAGNOSIS — M542 Cervicalgia: Secondary | ICD-10-CM

## 2023-10-15 ENCOUNTER — Ambulatory Visit: Payer: Self-pay | Admitting: Surgery

## 2023-10-15 NOTE — H&P (Signed)
Subjective   Chief Complaint: New Consultation (Umbilical hernia)     History of Present Illness: Andrew Blevins is a 62 y.o. male who is seen today as an office consultation at the request of Dr. Donette Larry for evaluation of New Consultation (Umbilical hernia) .   This is a 62 year old male with hypertension and obesity who presents with several months of an enlarging bulge at his umbilicus.  It remains reducible.  The patient works for [employer] and his job does require some lifting.  He has developed some discomfort in this area as the bulge seems to be getting larger.  He denies any GI obstructive symptoms.   Review of Systems: A complete review of systems was obtained from the patient.  I have reviewed this information and discussed as appropriate with the patient.  See HPI as well for other ROS.  Review of Systems  Constitutional:  Positive for weight loss.  HENT: Negative.    Eyes: Negative.   Respiratory: Negative.    Cardiovascular: Negative.   Gastrointestinal: Negative.   Genitourinary: Negative.   Musculoskeletal: Negative.   Skin: Negative.   Neurological: Negative.   Endo/Heme/Allergies: Negative.   Psychiatric/Behavioral: Negative.        Medical History: Past Medical History:  Diagnosis Date   Sleep apnea     Patient Active Problem List  Diagnosis   Benign essential hypertension   DDD (degenerative disc disease), lumbar   Lumbar radiculopathy   Umbilical hernia without obstruction or gangrene    Past Surgical History:  Procedure Laterality Date   BACK SURGERY     09/2017 AND 03/2018     No Known Allergies  Current Outpatient Medications on File Prior to Visit  Medication Sig Dispense Refill   MOUNJARO 7.5 mg/0.5 mL pen injector INJECT 7.5MG  (0.5ML) UNDER THE SKIN ONCE WEEKLY 90 DAYS     olmesartan-hydroCHLOROthiazide (BENICAR HCT) 40-12.5 mg tablet Take 1 tablet by mouth once daily     pantoprazole (PROTONIX) 40 MG DR tablet TAKE 1 TABLET  BY MOUTH DAILY 30 MINUTES PRIOR TO A MEAL     No current facility-administered medications on file prior to visit.    Family History  Problem Relation Age of Onset   High blood pressure (Hypertension) Sister    Diabetes Sister    High blood pressure (Hypertension) Brother      Social History   Tobacco Use  Smoking Status Never  Smokeless Tobacco Never     Social History   Socioeconomic History   Marital status: Single  Tobacco Use   Smoking status: Never   Smokeless tobacco: Never  Vaping Use   Vaping status: Never Used  Substance and Sexual Activity   Alcohol use: Not Currently   Drug use: Never    Objective:    Vitals:   10/15/23 1026  BP: 113/75  Pulse: 69  Temp: 36.7 C (98.1 F)  SpO2: 98%  Weight: (!) 117.3 kg (258 lb 9.6 oz)  Height: 180.3 cm (5\' 11" )  PainSc: 0-No pain    Body mass index is 36.07 kg/m.  Physical Exam   Constitutional:  WDWN in NAD, conversant, no obvious deformities; lying in bed comfortably Eyes:  Pupils equal, round; sclera anicteric; moist conjunctiva; no lid lag HENT:  Oral mucosa moist; good dentition  Neck:  No masses palpated, trachea midline; no thyromegaly Lungs:  CTA bilaterally; normal respiratory effort CV:  Regular rate and rhythm; no murmurs; extremities well-perfused with no edema Abd:  +bowel sounds, soft,  non-tender, no palpable organomegaly; visible protruding umbilical hernia.  This is mostly reducible when he is supine.  The defect seems to be 2 cm in diameter.  The hernia sac is about 4 cm in diameter. Musc: Normal gait; no apparent clubbing or cyanosis in extremities Lymphatic:  No palpable cervical or axillary lymphadenopathy Skin:  Warm, dry; no sign of jaundice Psychiatric - alert and oriented x 4; calm mood and affect   Assessment and Plan:  Diagnoses and all orders for this visit:  Umbilical hernia without obstruction or gangrene    Recommend umbilical hernia repair with mesh.The surgical  procedure has been discussed with the patient.  Potential risks, benefits, alternative treatments, and expected outcomes have been explained.  All of the patient's questions at this time have been answered.  The likelihood of reaching the patient's treatment goal is good.  The patient understands the proposed surgical procedure and wishes to proceed.   Lissa Morales, MD  10/15/2023 12:45 PM

## 2024-08-10 ENCOUNTER — Ambulatory Visit (INDEPENDENT_AMBULATORY_CARE_PROVIDER_SITE_OTHER): Admitting: Plastic Surgery

## 2024-08-10 VITALS — BP 123/78 | HR 76 | Ht 71.0 in | Wt 247.0 lb

## 2024-08-10 DIAGNOSIS — E65 Localized adiposity: Secondary | ICD-10-CM | POA: Diagnosis not present

## 2024-08-10 DIAGNOSIS — N62 Hypertrophy of breast: Secondary | ICD-10-CM | POA: Diagnosis not present

## 2024-08-10 DIAGNOSIS — L987 Excessive and redundant skin and subcutaneous tissue: Secondary | ICD-10-CM | POA: Diagnosis not present

## 2024-08-17 ENCOUNTER — Encounter: Payer: Self-pay | Admitting: Plastic Surgery

## 2024-08-17 NOTE — Progress Notes (Signed)
 Referring Provider Ransom Other, MD [address] [place],  [place] [address]   CC:  Chief Complaint  Patient presents with   Advice Only   Skin Problem      Andrew Blevins is an 63 y.o. male.  HPI: Andrew Blevins is a very pleasant 63 year old male who has lost in excess of 90 pounds and now presents for discussion of improvement of the appearance of his chest.  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  He has used weight training as a way to reshape his chest and the past.  He now has excess skin from his weight loss that he is interested in knowing what can be done to improve the appearance of his chest.  No Known Allergies  Outpatient Encounter Medications as of 08/10/2024  Medication Sig   naproxen  (NAPROSYN ) 375 MG tablet Take 1 tablet twice daily as needed for pain.   olmesartan (BENICAR) 20 MG tablet Take 20 mg by mouth daily.   olmesartan-hydrochlorothiazide (BENICAR HCT) 20-12.5 MG tablet Take 1 tablet by mouth daily.   RABEprazole (ACIPHEX) 20 MG tablet Take 20 mg by mouth daily.   No facility-administered encounter medications on file as of 08/10/2024.     Past Medical History:  Diagnosis Date   ED (erectile dysfunction)    GERD (gastroesophageal reflux disease)    History of Helicobacter pylori infection 02/2005   Hypertension     Past Surgical History:  Procedure Laterality Date   BACK SURGERY     INGUINAL HERNIA REPAIR Left 1997   SHOULDER SURGERY      Family History  Problem Relation Age of Onset   Cancer Mother 81   Hypertension Father    Gout Father    Hypertension Sister    Diabetes Brother    Hyperlipidemia Brother    Hypertension Brother    Heart disease Brother 65       CABG    Social History   Social History Narrative   Not on file     Review of Systems General: Denies fevers, chills, weight loss CV: Denies chest pain, shortness of breath, palpitations Skin: Excess skin around the breast and  axillas which the patient finds aesthetically displeasing  Physical Exam    08/10/2024    3:42 PM 05/30/2020    4:43 AM 05/30/2020   12:59 AM  Vitals with BMI  Height 5' 11    Weight 247 lbs    BMI 34.46    Systolic 123 152 870  Diastolic 78 91 81  Pulse 76 65 69    General:  No acute distress,  Alert and oriented, Non-Toxic, Normal speech and affect Skin: Patient has excess skin around both breast and in the axillas.  There is also a modest amount of fat behind the breast. Mammogram: Not applicable Assessment/Plan Excess skin and fat of the anterior and lateral chest: Patient is interested in contouring to improve the appearance of his chest.  He may see modest improvement from liposuction of the breasts only but the excess skin on the chest wall will require excision.  I showed him the location of the incisions which will be somewhat extensive.  It also will not completely flatten his chest or axilla.  At this time he is not 100% sure that that is what he wants to proceed with.  I believe that is reasonable for him to continue working on weight loss and to consider weight  training again to help with reshaping as much of his chest as possible prior to proceeding with surgery.  He is happy with this plan.  He will return to see me when he is ready to discuss further surgical treatment.  Andrew Blevins 08/17/2024, 5:28 PM

## 2025-01-11 NOTE — Progress Notes (Signed)
 Andrew Blevins                                          MRN: [id number]   01/11/2025   The [facility] Quality Team Specialist reviewed this patient medical record for the purposes of chart review for care gap closure. The following were reviewed: chart review for care gap closure-glycemic status assessment.    [facility] Quality Team
